# Patient Record
Sex: Male | Born: 1940 | Race: White | Hispanic: No | Smoking: Former smoker
Health system: Southern US, Community
[De-identification: ages and names within clinical notes are randomized; demographics above are authoritative.]

## PROBLEM LIST (undated history)

## (undated) DIAGNOSIS — I10 Essential (primary) hypertension: Secondary | ICD-10-CM

## (undated) DIAGNOSIS — I499 Cardiac arrhythmia, unspecified: Secondary | ICD-10-CM

## (undated) DIAGNOSIS — Z970 Presence of artificial eye: Secondary | ICD-10-CM

## (undated) DIAGNOSIS — K4030 Unilateral inguinal hernia, with obstruction, without gangrene, not specified as recurrent: Secondary | ICD-10-CM

## (undated) DIAGNOSIS — K219 Gastro-esophageal reflux disease without esophagitis: Secondary | ICD-10-CM

## (undated) DIAGNOSIS — I2510 Atherosclerotic heart disease of native coronary artery without angina pectoris: Secondary | ICD-10-CM

## (undated) DIAGNOSIS — R112 Nausea with vomiting, unspecified: Secondary | ICD-10-CM

## (undated) DIAGNOSIS — Z9889 Other specified postprocedural states: Secondary | ICD-10-CM

## (undated) DIAGNOSIS — M1990 Unspecified osteoarthritis, unspecified site: Secondary | ICD-10-CM

## (undated) DIAGNOSIS — N400 Enlarged prostate without lower urinary tract symptoms: Secondary | ICD-10-CM

## (undated) DIAGNOSIS — R0602 Shortness of breath: Secondary | ICD-10-CM

## (undated) DIAGNOSIS — E785 Hyperlipidemia, unspecified: Secondary | ICD-10-CM

## (undated) HISTORY — PX: CORONARY ANGIOPLASTY: SHX604

## (undated) HISTORY — PX: OTHER SURGICAL HISTORY: SHX169

## (undated) HISTORY — PX: HERNIA REPAIR: SHX51

---

## 2012-04-09 HISTORY — PX: CARDIAC CATHETERIZATION: SHX172

## 2013-11-10 ENCOUNTER — Other Ambulatory Visit: Payer: Self-pay | Admitting: Orthopedic Surgery

## 2013-11-10 NOTE — Progress Notes (Signed)
Preoperative surgical orders have been place into the Epic hospital system for Barry Morgan on 11/10/2013, 9:50 AM  by Patrica Duel for surgery on 11-25-2013.  Preop Total Hip - Anterior Approach orders including Experel Injecion, PO Tylenol, and IV Decadron as long as there are no contraindications to the above medications. Avel Peace, PA-C

## 2013-11-17 ENCOUNTER — Encounter (HOSPITAL_COMMUNITY): Payer: Self-pay | Admitting: Pharmacy Technician

## 2013-11-19 ENCOUNTER — Ambulatory Visit (HOSPITAL_COMMUNITY)
Admission: RE | Admit: 2013-11-19 | Discharge: 2013-11-19 | Disposition: A | Discharge status: Home / Self Care | Payer: Medicare Other | Source: Ambulatory Visit | Attending: Orthopedic Surgery | Admitting: Orthopedic Surgery

## 2013-11-19 ENCOUNTER — Encounter (HOSPITAL_COMMUNITY)
Admission: RE | Admit: 2013-11-19 | Discharge: 2013-11-19 | Disposition: A | Discharge status: Home / Self Care | Payer: Medicare Other | Source: Ambulatory Visit | Attending: Orthopedic Surgery | Admitting: Orthopedic Surgery

## 2013-11-19 ENCOUNTER — Encounter (HOSPITAL_COMMUNITY): Payer: Self-pay

## 2013-11-19 DIAGNOSIS — Z01818 Encounter for other preprocedural examination: Secondary | ICD-10-CM | POA: Insufficient documentation

## 2013-11-19 DIAGNOSIS — M25859 Other specified joint disorders, unspecified hip: Secondary | ICD-10-CM | POA: Insufficient documentation

## 2013-11-19 DIAGNOSIS — M76899 Other specified enthesopathies of unspecified lower limb, excluding foot: Secondary | ICD-10-CM | POA: Insufficient documentation

## 2013-11-19 DIAGNOSIS — Z01812 Encounter for preprocedural laboratory examination: Secondary | ICD-10-CM | POA: Insufficient documentation

## 2013-11-19 HISTORY — DX: Hyperlipidemia, unspecified: E78.5

## 2013-11-19 HISTORY — DX: Unspecified osteoarthritis, unspecified site: M19.90

## 2013-11-19 HISTORY — DX: Cardiac arrhythmia, unspecified: I49.9

## 2013-11-19 HISTORY — DX: Benign prostatic hyperplasia without lower urinary tract symptoms: N40.0

## 2013-11-19 HISTORY — DX: Shortness of breath: R06.02

## 2013-11-19 HISTORY — DX: Essential (primary) hypertension: I10

## 2013-11-19 HISTORY — DX: Atherosclerotic heart disease of native coronary artery without angina pectoris: I25.10

## 2013-11-19 HISTORY — DX: Other specified postprocedural states: R11.2

## 2013-11-19 HISTORY — DX: Gastro-esophageal reflux disease without esophagitis: K21.9

## 2013-11-19 HISTORY — DX: Other specified postprocedural states: Z98.890

## 2013-11-19 HISTORY — DX: Presence of artificial eye: Z97.0

## 2013-11-19 LAB — URINALYSIS, ROUTINE W REFLEX MICROSCOPIC
Bilirubin Urine: NEGATIVE
Glucose, UA: NEGATIVE mg/dL
Hgb urine dipstick: NEGATIVE
Leukocytes, UA: NEGATIVE
Nitrite: NEGATIVE
Specific Gravity, Urine: 1.018 (ref 1.005–1.030)
Urobilinogen, UA: 0.2 mg/dL (ref 0.0–1.0)
pH: 5.5 (ref 5.0–8.0)

## 2013-11-19 LAB — CBC
HCT: 40.4 % (ref 39.0–52.0)
MCH: 30.8 pg (ref 26.0–34.0)
MCHC: 33.9 g/dL (ref 30.0–36.0)
MCV: 90.8 fL (ref 78.0–100.0)
RBC: 4.45 MIL/uL (ref 4.22–5.81)
RDW: 13.7 % (ref 11.5–15.5)
WBC: 6 10*3/uL (ref 4.0–10.5)

## 2013-11-19 LAB — COMPREHENSIVE METABOLIC PANEL
BUN: 27 mg/dL — ABNORMAL HIGH (ref 6–23)
CO2: 25 mEq/L (ref 19–32)
Calcium: 9.6 mg/dL (ref 8.4–10.5)
Chloride: 100 mEq/L (ref 96–112)
Creatinine, Ser: 1.25 mg/dL (ref 0.50–1.35)
GFR calc non Af Amer: 56 mL/min — ABNORMAL LOW (ref >90)
Sodium: 136 mEq/L (ref 135–145)
Total Bilirubin: 0.5 mg/dL (ref 0.3–1.2)

## 2013-11-19 LAB — PROTIME-INR
INR: 0.91 (ref 0.00–1.49)
Prothrombin Time: 12.1 seconds (ref 11.6–15.2)

## 2013-11-19 LAB — ABO/RH: ABO/RH(D): B POS

## 2013-11-19 LAB — SURGICAL PCR SCREEN: MRSA, PCR: NEGATIVE

## 2013-11-19 NOTE — Patient Instructions (Addendum)
YOUR SURGERY IS SCHEDULED AT [facility]  ON:  Wednesday  12/17  REPORT TO  SHORT STAY CENTER AT:  8:30 AM      PHONE # FOR SHORT STAY IS [phone]  STAY ON YOUR ASPIRIN AND STAY ON ALL OF YOUR PRESCRIPTION MEDICATIONS  - EXCEPT PLAVIX  ( UNLESS OTHERWISE INSTRUCTED BY DR. ALUISIO'S OFFICE ) . STOP YOUR PLAVIX TODAY - AS PER ORDER DR. DR. Wynonia Hazard- YOUR CARDIOLOGIST.  DO NOT EAT OR DRINK ANYTHING AFTER MIDNIGHT THE NIGHT BEFORE YOUR SURGERY.  YOU MAY BRUSH YOUR TEETH, RINSE OUT YOUR MOUTH--BUT NO WATER, NO FOOD, NO CHEWING GUM, NO MINTS, NO CANDIES, NO CHEWING TOBACCO.  PLEASE TAKE THE FOLLOWING MEDICATIONS THE AM OF YOUR SURGERY WITH A FEW SIPS OF WATER:  PROTONIX. ASPIRIN.  IF YOU USE INHALERS--USE YOUR INHALERS THE AM OF YOUR SURGERY AND BRING INHALERS TO THE HOSPITAL.   IF YOU ARE DIABETIC:  DO NOT TAKE ANY DIABETIC MEDICATIONS THE AM OF YOUR SURGERY.  IF YOU TAKE INSULIN IN THE EVENINGS--PLEASE ONLY TAKE 1/2 NORMAL EVENING DOSE THE NIGHT BEFORE YOUR SURGERY.  NO INSULIN THE AM OF YOUR SURGERY. IF YOU HAVE SLEEP APNEA AND USE CPAP OR BIPAP--PLEASE BRING THE MASK AND THE TUBING.  DO NOT BRING YOUR MACHINE.  DO NOT BRING VALUABLES, MONEY, CREDIT CARDS.  DO NOT WEAR JEWELRY, MAKE-UP, NAIL POLISH AND NO METAL PINS OR CLIPS IN YOUR HAIR. CONTACT LENS, DENTURES / PARTIALS, GLASSES SHOULD NOT BE WORN TO SURGERY AND IN MOST CASES-HEARING AIDS WILL NEED TO BE REMOVED.  BRING YOUR GLASSES CASE, ANY EQUIPMENT NEEDED FOR YOUR CONTACT LENS. FOR PATIENTS ADMITTED TO THE HOSPITAL--CHECK OUT TIME THE DAY OF DISCHARGE IS 11:00 AM.  ALL INPATIENT ROOMS ARE PRIVATE - WITH BATHROOM, TELEPHONE, TELEVISION AND WIFI INTERNET.                                                     PLEASE READ OVER ANY  FACT SHEETS THAT YOU WERE GIVEN: MRSA INFORMATION, BLOOD TRANSFUSION INFORMATION, INCENTIVE SPIROMETER INFORMATION.  FAILURE TO FOLLOW THESE INSTRUCTIONS MAY RESULT IN THE CANCELLATION OF YOUR  SURGERY. PLEASE BE AWARE THAT YOU MAY NEED ADDITIONAL BLOOD DRAWN DAY OF YOUR SURGERY  PATIENT SIGNATURE_________________________________

## 2013-11-19 NOTE — Pre-Procedure Instructions (Signed)
CXR  WAS DONE TODAY - AS PER ANESTHESIOLOGIST'S GUIDELINES. EKG REPORT 05/22/13, ECHO REPORT 04/11/12., STRESS TEST REPORT 04/17/12, CARDIAC CATH /STENT PLACEMENT REPORT 04/28/12 - NOTE OF CARDIAC CLEARANCE AND STAY ON ASA BUT STOP PLAVIX 7 DAYS BEFORE SURGERY, AND CARDIOLOGY OFFICE NOTES 09/03/23 ON PT'S CHART FROM DR. KHAWAJA - [facility] IN [place].

## 2013-11-19 NOTE — Pre-Procedure Instructions (Signed)
PT'S CMET REPORT FAXED TO DR. ALUISIO'S OFFICE - BUN 27.

## 2013-11-24 ENCOUNTER — Other Ambulatory Visit: Payer: Self-pay | Admitting: Orthopedic Surgery

## 2013-11-24 NOTE — H&P (Signed)
Barry Morgan  DOB: 02/02/1941 Married / Language: English / Race: White Male  Date of Admission:  11-25-2013  Chief Complaint:  Left Hip Pain  History of Present Illness The patient is a 72 year old male who comes in today for a preoperative History and Physical. The patient is scheduled for a left total hip arthroplasty (anterior approach) to be performed by Dr. Frank V. Aluisio, MD at [facility] on 11/25/2013. The patient is a 72 year old male who presents for a recheck of Follow-up Hip. The patient is being followed for their left hip pain and osteoarthritis. They are 16 day(s) out from last IA hip cortisone injection (dr. ramos 09/09/2013). Symptoms reported today include: pain, pain at night, throbbing, stiffness (loss of motion), giving way, instability, pain with weightbearing and difficulty ambulating. The patient feels that they are doing 70 percent better (pain) and report their pain level to be moderate and 4-6 / 10. Current treatment includes: NSAIDs. The following medication has been used for pain control: antiinflammatory medication (mobic) and Tylenol. The patient presents today following IA cortisone injection-dr. ramos. The patient has reported improvement of their symptoms with: Cortisone injections (09/09/2013). The patient indicates that they have questions or concerns today regarding pain and their progress at this point. He states the hip has been bothering him for a long time. He is ready to go ahead and get it fixed. They have been treated conservatively in the past for the above stated problem and despite conservative measures, they continue to have progressive pain and severe functional limitations and dysfunction. They have failed non-operative management including home exercise, medications, and injections. It is felt that they would benefit from undergoing total joint replacement. Risks and benefits of the procedure have been discussed with the patient and  they elect to proceed with surgery. There are no active contraindications to surgery such as ongoing infection or rapidly progressive neurological disease.   Problem List/Past Medical Hip osteoarthritis (715.95). left Prostate Disease Sleep Apnea Gastroesophageal Reflux Disease Hypercholesterolemia Osteoarthritis Coronary artery disease Anxiety Disorder Cardiac Arrhythmia . Paroxsymal Atrial Fib.   Allergies No Known Drug Allergies    Family History Heart Disease. Father. father Cerebrovascular Accident. father First Degree Relatives. reported Osteoarthritis. father, sister, brother and grandfather fathers side    Social History Drug/Alcohol Rehab (Previously). no Exercise. Exercises weekly; does running / walking and other Former smoker Drug/Alcohol Rehab (Currently). no Current work status. retired Illicit drug use. no Tobacco / smoke exposure. no Pain Contract. yes Living situation. live alone Marital status. married Number of flights of stairs before winded. 4-5 Alcohol use. current drinker; only occasionally per week Tobacco use. Former smoker.    Medication History Amiodarone HCl (200MG Tablet, Oral) Active. Aspirin EC (81MG Tablet DR, Oral) Active. Atorvastatin Calcium (40MG Tablet, Oral) Active. Clopidogrel Bisulfate (75MG Tablet, Oral) Active. Glucosamine-Chondroitin (500-400MG Capsule, Oral) Active. Lisinopril (5MG Tablet, Oral) Active. Meloxicam (7.5MG Tablet, Oral) Active. Pantoprazole Sodium (40MG Tablet DR, Oral) Active. Tamsulosin HCl (0.4MG Capsule, Oral) Active. Multivitamins ( Oral) Active.    Past Surgical History Straighten Nasal Septum Hip Fracture and Surgery. left Colon Polyp Removal - Colonoscopy Heart Stents. One   Review of Systems General:Not Present- Chills, Fever, Night Sweats, Fatigue, Weight Gain, Weight Loss and Memory Loss. Skin:Not Present- Hives, Itching, Rash, Eczema and  Lesions. HEENT:Not Present- Tinnitus, Headache, Double Vision, Visual Loss, Hearing Loss and Dentures. Respiratory:Not Present- Shortness of breath with exertion, Shortness of breath at rest, Allergies, Coughing up blood and Chronic Cough.   Cardiovascular:Not Present- Chest Pain, Racing/skipping heartbeats, Difficulty Breathing Lying Down, Murmur, Swelling and Palpitations. Gastrointestinal:Not Present- Bloody Stool, Heartburn, Abdominal Pain, Vomiting, Nausea, Constipation, Diarrhea, Difficulty Swallowing, Jaundice and Loss of appetitie. Male Genitourinary:Present- Urinary frequency and Urinating at Night. Not Present- Blood in Urine, Weak urinary stream, Discharge, Flank Pain, Incontinence, Painful Urination and Urinary Retention. Musculoskeletal:Present- Joint Pain. Not Present- Muscle Weakness, Muscle Pain, Joint Swelling, Back Pain, Morning Stiffness and Spasms. Neurological:Not Present- Tremor, Dizziness, Blackout spells, Paralysis, Difficulty with balance and Weakness. Psychiatric:Not Present- Insomnia.    Vitals Weight: 215 lb Height: 71 in Weight was reported by patient. Height was reported by patient. Body Surface Area: 2.21 m Body Mass Index: 29.99 kg/m Pulse: 68 (Regular) Resp.: 14 (Unlabored) BP: 132/64 (Sitting, Right Arm, Standard)    Physical Exam The physical exam findings are as follows:   General Mental Status - Alert, cooperative and good historian. General Appearance- pleasant. Not in acute distress. Orientation- Oriented X3. Build & Nutrition- Well nourished and Well developed.   Head and Neck Head- normocephalic, atraumatic . Neck Global Assessment- supple. no bruit auscultated on the right and no bruit auscultated on the left.   Eye Pupil- Bilateral- Regular and Round. Motion- Bilateral- EOMI.   Chest and Lung Exam Auscultation: Breath sounds:- clear at anterior chest wall and - clear at posterior chest  wall. Adventitious sounds:- No Adventitious sounds.   Cardiovascular Auscultation:Rhythm- Regular rate and rhythm. Heart Sounds- S1 WNL and S2 WNL. Murmurs & Other Heart Sounds:Auscultation of the heart reveals - No Murmurs.   Abdomen Palpation/Percussion:Tenderness- Abdomen is non-tender to palpation. Rigidity (guarding)- Abdomen is soft. Auscultation:Auscultation of the abdomen reveals - Bowel sounds normal.   Male Genitourinary Not done, not pertinent to present illness  Musculoskeletal He is alert and oriented in no apparent distress. Evaluation of his left hip shows flexion to about 90. No internal rotation, about 10 degrees external rotation, 10-20 degrees of abduction. Right hip moves normally.  RADIOGRAPHS: Radiographs - AP pelvis, lateral of the left hip, he has severe bone on bone arthritis of the left hip, subchondral cyst, and large osteophyte formation.   Assessment & Plan Hip osteoarthritis (715.95) Story: left Impression: Left Hip  Note: Plan is for a Left Total Hip Replacement - Anterior Approach by Dr. Aluisio.  Plan is to go to [facility]  PCP - Dr. Clark - Patient has been seen preoperatively and felt to be stable for surgery. Cardiology - Dr. Khawaja - Patient has been seen preoperatively and felt to be stable for surgery. The cardiologist was okay stopping the Plavix 5 days prior to surgery but wanted him to stay on the Aspirin 81 mg throughout his surgical process.  The patient will not receive TXA (tranexamic acid) due to: Coronary Heart Disease  Signed electronically by Casady Voshell L Mirza Fessel, III PA-C 

## 2013-11-25 ENCOUNTER — Encounter (HOSPITAL_COMMUNITY): Payer: Self-pay | Admitting: *Deleted

## 2013-11-25 ENCOUNTER — Inpatient Hospital Stay (HOSPITAL_COMMUNITY): Payer: Medicare Other | Admitting: Anesthesiology

## 2013-11-25 ENCOUNTER — Inpatient Hospital Stay (HOSPITAL_COMMUNITY): Payer: Medicare Other

## 2013-11-25 ENCOUNTER — Inpatient Hospital Stay (HOSPITAL_COMMUNITY)
Admission: RE | Admit: 2013-11-25 | Discharge: 2013-11-28 | DRG: 470 | Disposition: A | Discharge status: Skilled Nursing Facility | Payer: Medicare Other | Source: Ambulatory Visit | Attending: Orthopedic Surgery | Admitting: Orthopedic Surgery

## 2013-11-25 ENCOUNTER — Encounter (HOSPITAL_COMMUNITY)
Admission: RE | Disposition: A | Discharge status: Skilled Nursing Facility | Payer: Self-pay | Source: Ambulatory Visit | Attending: Orthopedic Surgery

## 2013-11-25 ENCOUNTER — Encounter (HOSPITAL_COMMUNITY): Payer: Medicare Other | Admitting: Anesthesiology

## 2013-11-25 DIAGNOSIS — Z7982 Long term (current) use of aspirin: Secondary | ICD-10-CM

## 2013-11-25 DIAGNOSIS — N400 Enlarged prostate without lower urinary tract symptoms: Secondary | ICD-10-CM | POA: Diagnosis present

## 2013-11-25 DIAGNOSIS — E7800 Pure hypercholesterolemia, unspecified: Secondary | ICD-10-CM | POA: Diagnosis present

## 2013-11-25 DIAGNOSIS — Z96649 Presence of unspecified artificial hip joint: Secondary | ICD-10-CM

## 2013-11-25 DIAGNOSIS — I10 Essential (primary) hypertension: Secondary | ICD-10-CM | POA: Diagnosis present

## 2013-11-25 DIAGNOSIS — Z8249 Family history of ischemic heart disease and other diseases of the circulatory system: Secondary | ICD-10-CM

## 2013-11-25 DIAGNOSIS — Z9861 Coronary angioplasty status: Secondary | ICD-10-CM

## 2013-11-25 DIAGNOSIS — D62 Acute posthemorrhagic anemia: Secondary | ICD-10-CM | POA: Diagnosis not present

## 2013-11-25 DIAGNOSIS — F411 Generalized anxiety disorder: Secondary | ICD-10-CM | POA: Diagnosis present

## 2013-11-25 DIAGNOSIS — I4891 Unspecified atrial fibrillation: Secondary | ICD-10-CM | POA: Diagnosis present

## 2013-11-25 DIAGNOSIS — I2510 Atherosclerotic heart disease of native coronary artery without angina pectoris: Secondary | ICD-10-CM | POA: Diagnosis present

## 2013-11-25 DIAGNOSIS — M1610 Unilateral primary osteoarthritis, unspecified hip: Principal | ICD-10-CM | POA: Diagnosis present

## 2013-11-25 DIAGNOSIS — E785 Hyperlipidemia, unspecified: Secondary | ICD-10-CM | POA: Diagnosis present

## 2013-11-25 DIAGNOSIS — E871 Hypo-osmolality and hyponatremia: Secondary | ICD-10-CM | POA: Diagnosis not present

## 2013-11-25 DIAGNOSIS — K219 Gastro-esophageal reflux disease without esophagitis: Secondary | ICD-10-CM | POA: Diagnosis present

## 2013-11-25 DIAGNOSIS — Z87891 Personal history of nicotine dependence: Secondary | ICD-10-CM

## 2013-11-25 DIAGNOSIS — G4730 Sleep apnea, unspecified: Secondary | ICD-10-CM | POA: Diagnosis present

## 2013-11-25 DIAGNOSIS — M169 Osteoarthritis of hip, unspecified: Principal | ICD-10-CM | POA: Diagnosis present

## 2013-11-25 DIAGNOSIS — Z970 Presence of artificial eye: Secondary | ICD-10-CM

## 2013-11-25 HISTORY — PX: TOTAL HIP ARTHROPLASTY: SHX124

## 2013-11-25 SURGERY — ARTHROPLASTY, HIP, TOTAL, ANTERIOR APPROACH
Anesthesia: General | Site: Hip | Laterality: Left

## 2013-11-25 MED ORDER — ACETAMINOPHEN 325 MG PO TABS
650.0000 mg | ORAL_TABLET | Freq: Four times a day (QID) | ORAL | Status: DC | PRN
  Administered 2013-11-28: 650 mg via ORAL
  Filled 2013-11-25: qty 2

## 2013-11-25 MED ORDER — PHENOL 1.4 % MT LIQD
1.0000 | OROMUCOSAL | Status: DC | PRN
  Filled 2013-11-25: qty 177

## 2013-11-25 MED ORDER — ACETAMINOPHEN 500 MG PO TABS
1000.0000 mg | ORAL_TABLET | Freq: Four times a day (QID) | ORAL | Status: AC
  Administered 2013-11-25 – 2013-11-26 (×4): 1000 mg via ORAL
  Filled 2013-11-25 (×5): qty 2

## 2013-11-25 MED ORDER — AMIODARONE HCL 200 MG PO TABS
200.0000 mg | ORAL_TABLET | Freq: Every day | ORAL | Status: DC
  Administered 2013-11-25 – 2013-11-27 (×3): 200 mg via ORAL
  Filled 2013-11-25 (×4): qty 1

## 2013-11-25 MED ORDER — DOCUSATE SODIUM 100 MG PO CAPS
100.0000 mg | ORAL_CAPSULE | Freq: Two times a day (BID) | ORAL | Status: DC
  Administered 2013-11-25 – 2013-11-28 (×6): 100 mg via ORAL

## 2013-11-25 MED ORDER — PROPOFOL 10 MG/ML IV BOLUS
INTRAVENOUS | Status: AC
  Filled 2013-11-25: qty 20

## 2013-11-25 MED ORDER — RIVAROXABAN 10 MG PO TABS
10.0000 mg | ORAL_TABLET | Freq: Every day | ORAL | Status: DC
  Administered 2013-11-26 – 2013-11-28 (×3): 10 mg via ORAL
  Filled 2013-11-25 (×4): qty 1

## 2013-11-25 MED ORDER — METHOCARBAMOL 500 MG PO TABS
500.0000 mg | ORAL_TABLET | Freq: Four times a day (QID) | ORAL | Status: DC | PRN
  Administered 2013-11-25 – 2013-11-27 (×4): 500 mg via ORAL
  Filled 2013-11-25 (×5): qty 1

## 2013-11-25 MED ORDER — ACETAMINOPHEN 650 MG RE SUPP: 650.0000 mg | Freq: Four times a day (QID) | RECTAL | Status: DC | PRN

## 2013-11-25 MED ORDER — ATORVASTATIN CALCIUM 40 MG PO TABS
40.0000 mg | ORAL_TABLET | Freq: Every day | ORAL | Status: DC
  Administered 2013-11-25 – 2013-11-27 (×3): 40 mg via ORAL
  Filled 2013-11-25 (×4): qty 1

## 2013-11-25 MED ORDER — NEOSTIGMINE METHYLSULFATE 1 MG/ML IJ SOLN
INTRAMUSCULAR | Status: AC
  Filled 2013-11-25: qty 10

## 2013-11-25 MED ORDER — KETOROLAC TROMETHAMINE 15 MG/ML IJ SOLN
7.5000 mg | Freq: Four times a day (QID) | INTRAMUSCULAR | Status: AC | PRN
  Administered 2013-11-25: 7.5 mg via INTRAVENOUS
  Filled 2013-11-25: qty 1

## 2013-11-25 MED ORDER — ONDANSETRON HCL 4 MG PO TABS
4.0000 mg | ORAL_TABLET | Freq: Four times a day (QID) | ORAL | Status: DC | PRN
  Administered 2013-11-26 – 2013-11-27 (×2): 4 mg via ORAL
  Filled 2013-11-25 (×2): qty 1

## 2013-11-25 MED ORDER — DIPHENHYDRAMINE HCL 12.5 MG/5ML PO ELIX
12.5000 mg | ORAL_SOLUTION | ORAL | Status: DC | PRN
  Administered 2013-11-27 (×2): 25 mg via ORAL
  Filled 2013-11-25 (×2): qty 10

## 2013-11-25 MED ORDER — CEFAZOLIN SODIUM-DEXTROSE 2-3 GM-% IV SOLR
INTRAVENOUS | Status: AC
  Filled 2013-11-25: qty 50

## 2013-11-25 MED ORDER — DEXAMETHASONE SODIUM PHOSPHATE 10 MG/ML IJ SOLN
10.0000 mg | Freq: Every day | INTRAMUSCULAR | Status: AC
  Filled 2013-11-25: qty 1

## 2013-11-25 MED ORDER — DEXAMETHASONE 6 MG PO TABS
10.0000 mg | ORAL_TABLET | Freq: Every day | ORAL | Status: AC
  Administered 2013-11-26: 09:00:00 10 mg via ORAL
  Filled 2013-11-25: qty 1

## 2013-11-25 MED ORDER — BISACODYL 10 MG RE SUPP
10.0000 mg | Freq: Every day | RECTAL | Status: DC | PRN
  Administered 2013-11-27: 10 mg via RECTAL
  Filled 2013-11-25 (×2): qty 1

## 2013-11-25 MED ORDER — GLYCOPYRROLATE 0.2 MG/ML IJ SOLN
INTRAMUSCULAR | Status: AC
  Filled 2013-11-25: qty 2

## 2013-11-25 MED ORDER — PROMETHAZINE HCL 25 MG/ML IJ SOLN: 6.2500 mg | INTRAMUSCULAR | Status: DC | PRN

## 2013-11-25 MED ORDER — DEXAMETHASONE SODIUM PHOSPHATE 10 MG/ML IJ SOLN
INTRAMUSCULAR | Status: DC | PRN
  Administered 2013-11-25: 10 mg via INTRAVENOUS

## 2013-11-25 MED ORDER — BUPIVACAINE HCL (PF) 0.25 % IJ SOLN
INTRAMUSCULAR | Status: AC
  Filled 2013-11-25: qty 30

## 2013-11-25 MED ORDER — MENTHOL 3 MG MT LOZG
1.0000 | LOZENGE | OROMUCOSAL | Status: DC | PRN
  Administered 2013-11-26: 21:00:00 3 mg via ORAL
  Filled 2013-11-25: qty 9

## 2013-11-25 MED ORDER — METOCLOPRAMIDE HCL 5 MG/ML IJ SOLN
5.0000 mg | Freq: Three times a day (TID) | INTRAMUSCULAR | Status: DC | PRN
  Administered 2013-11-26: 10 mg via INTRAVENOUS
  Filled 2013-11-25: qty 2

## 2013-11-25 MED ORDER — EPHEDRINE SULFATE 50 MG/ML IJ SOLN
INTRAMUSCULAR | Status: AC
  Filled 2013-11-25: qty 1

## 2013-11-25 MED ORDER — POLYETHYLENE GLYCOL 3350 17 G PO PACK
17.0000 g | PACK | Freq: Every day | ORAL | Status: DC | PRN
  Administered 2013-11-27: 12:00:00 17 g via ORAL

## 2013-11-25 MED ORDER — BUPIVACAINE HCL 0.25 % IJ SOLN
INTRAMUSCULAR | Status: DC | PRN
  Administered 2013-11-25: 20 mL

## 2013-11-25 MED ORDER — EPHEDRINE SULFATE 50 MG/ML IJ SOLN
INTRAMUSCULAR | Status: DC | PRN
  Administered 2013-11-25 (×2): 5 mg via INTRAVENOUS

## 2013-11-25 MED ORDER — ONDANSETRON HCL 4 MG/2ML IJ SOLN
INTRAMUSCULAR | Status: DC | PRN
  Administered 2013-11-25: 4 mg via INTRAVENOUS

## 2013-11-25 MED ORDER — SUCCINYLCHOLINE CHLORIDE 20 MG/ML IJ SOLN
INTRAMUSCULAR | Status: AC
  Filled 2013-11-25: qty 1

## 2013-11-25 MED ORDER — ACETAMINOPHEN 500 MG PO TABS
1000.0000 mg | ORAL_TABLET | Freq: Once | ORAL | Status: AC
  Administered 2013-11-25: 1000 mg via ORAL
  Filled 2013-11-25: qty 2

## 2013-11-25 MED ORDER — LACTATED RINGERS IV SOLN
INTRAVENOUS | Status: DC | PRN
  Administered 2013-11-25 (×2): via INTRAVENOUS

## 2013-11-25 MED ORDER — MIDAZOLAM HCL 5 MG/5ML IJ SOLN
INTRAMUSCULAR | Status: DC | PRN
  Administered 2013-11-25: 2 mg via INTRAVENOUS

## 2013-11-25 MED ORDER — HYDROMORPHONE HCL PF 1 MG/ML IJ SOLN
INTRAMUSCULAR | Status: AC
  Filled 2013-11-25: qty 1

## 2013-11-25 MED ORDER — HYDROMORPHONE HCL PF 1 MG/ML IJ SOLN
0.2500 mg | INTRAMUSCULAR | Status: DC | PRN
  Administered 2013-11-25 (×2): 0.5 mg via INTRAVENOUS

## 2013-11-25 MED ORDER — CEFAZOLIN SODIUM-DEXTROSE 2-3 GM-% IV SOLR
2.0000 g | INTRAVENOUS | Status: AC
  Administered 2013-11-25: 2 g via INTRAVENOUS

## 2013-11-25 MED ORDER — SODIUM CHLORIDE 0.9 % IV SOLN
INTRAVENOUS | Status: DC
  Administered 2013-11-25 – 2013-11-26 (×3): via INTRAVENOUS

## 2013-11-25 MED ORDER — MORPHINE SULFATE 10 MG/ML IJ SOLN: 1.0000 mg | INTRAMUSCULAR | Status: DC | PRN

## 2013-11-25 MED ORDER — LACTATED RINGERS IV SOLN: INTRAVENOUS | Status: DC

## 2013-11-25 MED ORDER — SODIUM CHLORIDE 0.9 % IJ SOLN
INTRAMUSCULAR | Status: AC
  Filled 2013-11-25: qty 20

## 2013-11-25 MED ORDER — SUCCINYLCHOLINE CHLORIDE 20 MG/ML IJ SOLN
INTRAMUSCULAR | Status: DC | PRN
  Administered 2013-11-25: 100 mg via INTRAVENOUS

## 2013-11-25 MED ORDER — DEXAMETHASONE SODIUM PHOSPHATE 10 MG/ML IJ SOLN
INTRAMUSCULAR | Status: AC
  Filled 2013-11-25: qty 1

## 2013-11-25 MED ORDER — CHLORHEXIDINE GLUCONATE 4 % EX LIQD: 60.0000 mL | Freq: Once | CUTANEOUS | Status: DC

## 2013-11-25 MED ORDER — GLYCOPYRROLATE 0.2 MG/ML IJ SOLN
INTRAMUSCULAR | Status: DC | PRN
  Administered 2013-11-25: .6 mg via INTRAVENOUS

## 2013-11-25 MED ORDER — TAMSULOSIN HCL 0.4 MG PO CAPS
0.4000 mg | ORAL_CAPSULE | Freq: Every day | ORAL | Status: DC
  Administered 2013-11-25 – 2013-11-28 (×4): 0.4 mg via ORAL
  Filled 2013-11-25 (×4): qty 1

## 2013-11-25 MED ORDER — 0.9 % SODIUM CHLORIDE (POUR BTL) OPTIME
TOPICAL | Status: DC | PRN
  Administered 2013-11-25: 1000 mL

## 2013-11-25 MED ORDER — PANTOPRAZOLE SODIUM 40 MG PO TBEC
40.0000 mg | DELAYED_RELEASE_TABLET | Freq: Every day | ORAL | Status: DC
  Administered 2013-11-26 – 2013-11-28 (×3): 40 mg via ORAL
  Filled 2013-11-25 (×3): qty 1

## 2013-11-25 MED ORDER — FENTANYL CITRATE 0.05 MG/ML IJ SOLN
INTRAMUSCULAR | Status: AC
  Filled 2013-11-25: qty 2

## 2013-11-25 MED ORDER — SODIUM CHLORIDE 0.9 % IJ SOLN
INTRAMUSCULAR | Status: DC | PRN
  Administered 2013-11-25: 13:00:00

## 2013-11-25 MED ORDER — SODIUM CHLORIDE 0.9 % IV SOLN: INTRAVENOUS | Status: DC

## 2013-11-25 MED ORDER — PROPOFOL 10 MG/ML IV BOLUS
INTRAVENOUS | Status: DC | PRN
  Administered 2013-11-25: 160 mg via INTRAVENOUS

## 2013-11-25 MED ORDER — DEXAMETHASONE SODIUM PHOSPHATE 10 MG/ML IJ SOLN: 10.0000 mg | Freq: Once | INTRAMUSCULAR | Status: DC

## 2013-11-25 MED ORDER — BUPIVACAINE LIPOSOME 1.3 % IJ SUSP
20.0000 mL | Freq: Once | INTRAMUSCULAR | Status: DC
  Filled 2013-11-25: qty 20

## 2013-11-25 MED ORDER — LIDOCAINE HCL (CARDIAC) 20 MG/ML IV SOLN
INTRAVENOUS | Status: DC | PRN
  Administered 2013-11-25: 100 mg via INTRAVENOUS

## 2013-11-25 MED ORDER — OXYCODONE HCL 5 MG PO TABS
5.0000 mg | ORAL_TABLET | ORAL | Status: DC | PRN
  Administered 2013-11-25 (×3): 5 mg via ORAL
  Administered 2013-11-26 – 2013-11-27 (×10): 10 mg via ORAL
  Administered 2013-11-27 – 2013-11-28 (×4): 5 mg via ORAL
  Filled 2013-11-25 (×3): qty 2
  Filled 2013-11-25: qty 1
  Filled 2013-11-25 (×2): qty 2
  Filled 2013-11-25: qty 1
  Filled 2013-11-25: qty 2
  Filled 2013-11-25: qty 1
  Filled 2013-11-25: qty 2
  Filled 2013-11-25 (×2): qty 1
  Filled 2013-11-25: qty 2
  Filled 2013-11-25: qty 1
  Filled 2013-11-25 (×3): qty 2

## 2013-11-25 MED ORDER — FENTANYL CITRATE 0.05 MG/ML IJ SOLN
INTRAMUSCULAR | Status: DC | PRN
  Administered 2013-11-25 (×4): 50 ug via INTRAVENOUS

## 2013-11-25 MED ORDER — TRAMADOL HCL 50 MG PO TABS: 50.0000 mg | ORAL_TABLET | Freq: Four times a day (QID) | ORAL | Status: DC | PRN

## 2013-11-25 MED ORDER — FLEET ENEMA 7-19 GM/118ML RE ENEM: 1.0000 | ENEMA | Freq: Once | RECTAL | Status: AC | PRN

## 2013-11-25 MED ORDER — ONDANSETRON HCL 4 MG/2ML IJ SOLN
INTRAMUSCULAR | Status: AC
  Filled 2013-11-25: qty 2

## 2013-11-25 MED ORDER — CEFAZOLIN SODIUM-DEXTROSE 2-3 GM-% IV SOLR
2.0000 g | Freq: Four times a day (QID) | INTRAVENOUS | Status: AC
  Administered 2013-11-25 – 2013-11-26 (×2): 2 g via INTRAVENOUS
  Filled 2013-11-25 (×2): qty 50

## 2013-11-25 MED ORDER — METOCLOPRAMIDE HCL 5 MG PO TABS
5.0000 mg | ORAL_TABLET | Freq: Three times a day (TID) | ORAL | Status: DC | PRN
  Filled 2013-11-25: qty 2

## 2013-11-25 MED ORDER — SODIUM CHLORIDE 0.9 % IJ SOLN
INTRAMUSCULAR | Status: AC
  Filled 2013-11-25: qty 50

## 2013-11-25 MED ORDER — ROCURONIUM BROMIDE 100 MG/10ML IV SOLN
INTRAVENOUS | Status: DC | PRN
  Administered 2013-11-25 (×2): 10 mg via INTRAVENOUS
  Administered 2013-11-25: 30 mg via INTRAVENOUS

## 2013-11-25 MED ORDER — METHOCARBAMOL 100 MG/ML IJ SOLN
500.0000 mg | Freq: Four times a day (QID) | INTRAVENOUS | Status: DC | PRN
  Administered 2013-11-25: 500 mg via INTRAVENOUS
  Filled 2013-11-25: qty 5

## 2013-11-25 MED ORDER — MIDAZOLAM HCL 2 MG/2ML IJ SOLN
INTRAMUSCULAR | Status: AC
  Filled 2013-11-25: qty 2

## 2013-11-25 MED ORDER — NEOSTIGMINE METHYLSULFATE 1 MG/ML IJ SOLN
INTRAMUSCULAR | Status: DC | PRN
  Administered 2013-11-25: 2 mg via INTRAVENOUS

## 2013-11-25 MED ORDER — ONDANSETRON HCL 4 MG/2ML IJ SOLN: 4.0000 mg | Freq: Four times a day (QID) | INTRAMUSCULAR | Status: DC | PRN

## 2013-11-25 SURGICAL SUPPLY — 39 items
BAG ZIPLOCK 12X15 (MISCELLANEOUS) ×4 IMPLANT
BLADE SAW SGTL 18X1.27X75 (BLADE) ×2 IMPLANT
CAPT HIP PF MOP ×2 IMPLANT
DECANTER SPIKE VIAL GLASS SM (MISCELLANEOUS) ×2 IMPLANT
DRAPE C-ARM 42X120 X-RAY (DRAPES) ×2 IMPLANT
DRAPE STERI IOBAN 125X83 (DRAPES) ×2 IMPLANT
DRAPE U-SHAPE 47X51 STRL (DRAPES) ×6 IMPLANT
DRSG ADAPTIC 3X8 NADH LF (GAUZE/BANDAGES/DRESSINGS) ×2 IMPLANT
DRSG MEPILEX BORDER 4X4 (GAUZE/BANDAGES/DRESSINGS) ×2 IMPLANT
DRSG MEPILEX BORDER 4X8 (GAUZE/BANDAGES/DRESSINGS) ×2 IMPLANT
DURAPREP 26ML APPLICATOR (WOUND CARE) ×2 IMPLANT
ELECT BLADE 6.5 EXT (BLADE) ×2 IMPLANT
ELECT REM PT RETURN 9FT ADLT (ELECTROSURGICAL) ×2
ELECTRODE REM PT RTRN 9FT ADLT (ELECTROSURGICAL) ×1 IMPLANT
EVACUATOR 1/8 PVC DRAIN (DRAIN) ×2 IMPLANT
FACESHIELD LNG OPTICON STERILE (SAFETY) ×8 IMPLANT
GLOVE BIO SURGEON STRL SZ7.5 (GLOVE) ×2 IMPLANT
GLOVE BIO SURGEON STRL SZ8 (GLOVE) ×4 IMPLANT
GLOVE BIOGEL PI IND STRL 8 (GLOVE) ×2 IMPLANT
GLOVE BIOGEL PI INDICATOR 8 (GLOVE) ×2
GOWN PREVENTION PLUS LG XLONG (DISPOSABLE) ×2 IMPLANT
GOWN STRL REIN XL XLG (GOWN DISPOSABLE) ×2 IMPLANT
KIT BASIN OR (CUSTOM PROCEDURE TRAY) ×2 IMPLANT
NDL SAFETY ECLIPSE 18X1.5 (NEEDLE) ×2 IMPLANT
NEEDLE HYPO 18GX1.5 SHARP (NEEDLE) ×2
PACK TOTAL JOINT (CUSTOM PROCEDURE TRAY) ×2 IMPLANT
PADDING CAST COTTON 6X4 STRL (CAST SUPPLIES) ×2 IMPLANT
SPONGE GAUZE 4X4 12PLY (GAUZE/BANDAGES/DRESSINGS) IMPLANT
STRIP CLOSURE SKIN 1/2X4 (GAUZE/BANDAGES/DRESSINGS) ×2 IMPLANT
SUCTION FRAZIER 12FR DISP (SUCTIONS) IMPLANT
SUT ETHIBOND NAB CT1 #1 30IN (SUTURE) ×2 IMPLANT
SUT MNCRL AB 4-0 PS2 18 (SUTURE) ×2 IMPLANT
SUT VIC AB 2-0 CT1 27 (SUTURE) ×2
SUT VIC AB 2-0 CT1 TAPERPNT 27 (SUTURE) ×2 IMPLANT
SUT VLOC 180 0 24IN GS25 (SUTURE) ×2 IMPLANT
SYR 20CC LL (SYRINGE) ×2 IMPLANT
SYR 50ML LL SCALE MARK (SYRINGE) ×2 IMPLANT
TOWEL OR 17X26 10 PK STRL BLUE (TOWEL DISPOSABLE) ×2 IMPLANT
TRAY FOLEY CATH 14FRSI W/METER (CATHETERS) ×2 IMPLANT

## 2013-11-25 NOTE — H&P (View-Only) (Signed)
Barry Morgan  DOB: 01/01/1941 Married / Language: English / Race: White Male  Date of Admission:  11-25-2013  Chief Complaint:  Left Hip Pain  History of Present Illness The patient is a 72 year old male who comes in today for a preoperative History and Physical. The patient is scheduled for a left total hip arthroplasty (anterior approach) to be performed by Dr. Gus Rankin. Aluisio, MD at [facility] on 11/25/2013. The patient is a 72 year old male who presents for a recheck of Follow-up Hip. The patient is being followed for their left hip pain and osteoarthritis. They are 16 day(s) out from last IA hip cortisone injection (dr. Ethelene Hal 09/09/2013). Symptoms reported today include: pain, pain at night, throbbing, stiffness (loss of motion), giving way, instability, pain with weightbearing and difficulty ambulating. The patient feels that they are doing 70 percent better (pain) and report their pain level to be moderate and 4-6 / 10. Current treatment includes: NSAIDs. The following medication has been used for pain control: antiinflammatory medication (mobic) and Tylenol. The patient presents today following IA cortisone injection-dr. ramos. The patient has reported improvement of their symptoms with: Cortisone injections (09/09/2013). The patient indicates that they have questions or concerns today regarding pain and their progress at this point. He states the hip has been bothering him for a long time. He is ready to go ahead and get it fixed. They have been treated conservatively in the past for the above stated problem and despite conservative measures, they continue to have progressive pain and severe functional limitations and dysfunction. They have failed non-operative management including home exercise, medications, and injections. It is felt that they would benefit from undergoing total joint replacement. Risks and benefits of the procedure have been discussed with the patient and  they elect to proceed with surgery. There are no active contraindications to surgery such as ongoing infection or rapidly progressive neurological disease.   Problem List/Past Medical Hip osteoarthritis (715.95). left Prostate Disease Sleep Apnea Gastroesophageal Reflux Disease Hypercholesterolemia Osteoarthritis Coronary artery disease Anxiety Disorder Cardiac Arrhythmia . Paroxsymal Atrial Fib.   Allergies No Known Drug Allergies    Family History Heart Disease. Father. father Cerebrovascular Accident. father First Degree Relatives. reported Osteoarthritis. father, sister, brother and grandfather fathers side    Social History Drug/Alcohol Rehab (Previously). no Exercise. Exercises weekly; does running / walking and other Former smoker Drug/Alcohol Rehab (Currently). no Current work status. retired Illicit drug use. no Tobacco / smoke exposure. no Pain Contract. yes Living situation. live alone Marital status. married Number of flights of stairs before winded. 4-5 Alcohol use. current drinker; only occasionally per week Tobacco use. Former smoker.    Medication History Amiodarone HCl (200MG  Tablet, Oral) Active. Aspirin EC (81MG  Tablet DR, Oral) Active. Atorvastatin Calcium (40MG  Tablet, Oral) Active. Clopidogrel Bisulfate (75MG  Tablet, Oral) Active. Glucosamine-Chondroitin (500-400MG  Capsule, Oral) Active. Lisinopril (5MG  Tablet, Oral) Active. Meloxicam (7.5MG  Tablet, Oral) Active. Pantoprazole Sodium (40MG  Tablet DR, Oral) Active. Tamsulosin HCl (0.4MG  Capsule, Oral) Active. Multivitamins ( Oral) Active.    Past Surgical History Straighten Nasal Septum Hip Fracture and Surgery. left Colon Polyp Removal - Colonoscopy Heart Stents. One   Review of Systems General:Not Present- Chills, Fever, Night Sweats, Fatigue, Weight Gain, Weight Loss and Memory Loss. Skin:Not Present- Hives, Itching, Rash, Eczema and  Lesions. HEENT:Not Present- Tinnitus, Headache, Double Vision, Visual Loss, Hearing Loss and Dentures. Respiratory:Not Present- Shortness of breath with exertion, Shortness of breath at rest, Allergies, Coughing up blood and Chronic Cough.  Cardiovascular:Not Present- Chest Pain, Racing/skipping heartbeats, Difficulty Breathing Lying Down, Murmur, Swelling and Palpitations. Gastrointestinal:Not Present- Bloody Stool, Heartburn, Abdominal Pain, Vomiting, Nausea, Constipation, Diarrhea, Difficulty Swallowing, Jaundice and Loss of appetitie. Male Genitourinary:Present- Urinary frequency and Urinating at Night. Not Present- Blood in Urine, Weak urinary stream, Discharge, Flank Pain, Incontinence, Painful Urination and Urinary Retention. Musculoskeletal:Present- Joint Pain. Not Present- Muscle Weakness, Muscle Pain, Joint Swelling, Back Pain, Morning Stiffness and Spasms. Neurological:Not Present- Tremor, Dizziness, Blackout spells, Paralysis, Difficulty with balance and Weakness. Psychiatric:Not Present- Insomnia.    Vitals Weight: 215 lb Height: 71 in Weight was reported by patient. Height was reported by patient. Body Surface Area: 2.21 m Body Mass Index: 29.99 kg/m Pulse: 68 (Regular) Resp.: 14 (Unlabored) BP: 132/64 (Sitting, Right Arm, Standard)    Physical Exam The physical exam findings are as follows:   General Mental Status - Alert, cooperative and good historian. General Appearance- pleasant. Not in acute distress. Orientation- Oriented X3. Build & Nutrition- Well nourished and Well developed.   Head and Neck Head- normocephalic, atraumatic . Neck Global Assessment- supple. no bruit auscultated on the right and no bruit auscultated on the left.   Eye Pupil- Bilateral- Regular and Round. Motion- Bilateral- EOMI.   Chest and Lung Exam Auscultation: Breath sounds:- clear at anterior chest wall and - clear at posterior chest  wall. Adventitious sounds:- No Adventitious sounds.   Cardiovascular Auscultation:Rhythm- Regular rate and rhythm. Heart Sounds- S1 WNL and S2 WNL. Murmurs & Other Heart Sounds:Auscultation of the heart reveals - No Murmurs.   Abdomen Palpation/Percussion:Tenderness- Abdomen is non-tender to palpation. Rigidity (guarding)- Abdomen is soft. Auscultation:Auscultation of the abdomen reveals - Bowel sounds normal.   Male Genitourinary Not done, not pertinent to present illness  Musculoskeletal He is alert and oriented in no apparent distress. Evaluation of his left hip shows flexion to about 90. No internal rotation, about 10 degrees external rotation, 10-20 degrees of abduction. Right hip moves normally.  RADIOGRAPHS: Radiographs - AP pelvis, lateral of the left hip, he has severe bone on bone arthritis of the left hip, subchondral cyst, and large osteophyte formation.   Assessment & Plan Hip osteoarthritis (715.95) Story: left Impression: Left Hip  Note: Plan is for a Left Total Hip Replacement - Anterior Approach by Dr. Lequita Halt.  Plan is to go to [facility]  PCP - Dr. Chestine Spore - Patient has been seen preoperatively and felt to be stable for surgery. Cardiology - Dr. Wynonia Hazard - Patient has been seen preoperatively and felt to be stable for surgery. The cardiologist was okay stopping the Plavix 5 days prior to surgery but wanted him to stay on the Aspirin 81 mg throughout his surgical process.  The patient will not receive TXA (tranexamic acid) due to: Coronary Heart Disease  Signed electronically by Lauraine Rinne, III PA-C

## 2013-11-25 NOTE — Addendum Note (Signed)
Addendum created 11/25/13 1418 by Doran Clay, CRNA   Modules edited: Anesthesia LDA

## 2013-11-25 NOTE — Interval H&P Note (Signed)
History and Physical Interval Note:  11/25/2013 10:05 AM  Barry Morgan  has presented today for surgery, with the diagnosis of OA LEFT HIP  The various methods of treatment have been discussed with the patient and family. After consideration of risks, benefits and other options for treatment, the patient has consented to  Procedure(s): LEFT TOTAL HIP ARTHROPLASTY ANTERIOR APPROACH (Left) as a surgical intervention .  The patient's history has been reviewed, patient examined, no change in status, stable for surgery.  I have reviewed the patient's chart and labs.  Questions were answered to the patient's satisfaction.     Loanne Drilling

## 2013-11-25 NOTE — Op Note (Signed)
OPERATIVE REPORT  PREOPERATIVE DIAGNOSIS: Osteoarthritis of the Left hip.   POSTOPERATIVE DIAGNOSIS: Osteoarthritis of the Left  hip.   PROCEDURE: Left total hip arthroplasty, anterior approach.   SURGEON: Ollen Gross, MD   ASSISTANT: Avel Peace, Pa-C  ANESTHESIA:  General  ESTIMATED BLOOD LOSS:- 700 ml  DRAINS: Hemovac x1.   COMPLICATIONS: None   CONDITION: PACU - hemodynamically stable.   BRIEF CLINICAL NOTE: Barry Morgan is a 72 y.o. male who has advanced end-  stage arthritis of his Left  hip with progressively worsening pain and  dysfunction.The patient has failed nonoperative management and presents for  total hip arthroplasty.   PROCEDURE IN DETAIL: After successful administration of spinal  anesthetic, the traction boots for the [facility] bed were placed on both  feet and the patient was placed onto the [facility] bed, boots placed into the leg  holders. The Left hip was then isolated from the perineum with plastic  drapes and prepped and draped in the usual sterile fashion. ASIS and  greater trochanter were marked and a oblique incision was made, starting  at about 1 cm lateral and 2 cm distal to the ASIS and coursing towards  the anterior cortex of the femur. The skin was cut with a 10 blade  through subcutaneous tissue to the level of the fascia overlying the  tensor fascia lata muscle. The fascia was then incised in line with the  incision at the junction of the anterior third and posterior 2/3rd. The  muscle was teased off the fascia and then the interval between the TFL  and the rectus was developed. The Hohmann retractor was then placed at  the top of the femoral neck over the capsule. The vessels overlying the  capsule were cauterized and the fat on top of the capsule was removed.  A Hohmann retractor was then placed anterior underneath the rectus  femoris to give exposure to the entire anterior capsule. A T-shaped  capsulotomy was performed. The  edges were tagged and the femoral head  was identified.       Osteophytes are removed off the superior acetabulum.  The femoral neck was then cut in situ with an oscillating saw. Traction  was then applied to the left lower extremity utilizing the [facility]  traction. The femoral head was then removed. Retractors were placed  around the acetabulum and then circumferential removal of the labrum was  Performed.He had a massive amount of  Osteophytes which were also removed. Reaming starts at 47 mm to  medialize and  Increased in 2 mm increments to 55 mm. We reamed in  approximately 40 degrees of abduction, 20 degrees anteversion. A 56 mm  pinnacle acetabular shell was then impacted in anatomic position under  fluoroscopic guidance with excellent purchase. We did not need to place  any additional dome screws. A 36 mm neutral + 4 marathon liner was then  placed into the acetabular shell.       The femoral lift was then placed along the lateral aspect of the femur  just distal to the vastus ridge. The leg was  externally rotated and capsule  was stripped off the inferior aspect of the femoral neck down to the  level of the lesser trochanter, this was done with electrocautery. The femur was lifted after this was performed. The  leg was then placed and extended in adducted position to essentially delivering the femur. We also removed the capsule superiorly and  the  piriformis from the piriformis fossa to gain excellent exposure of the  proximal femur. Rongeur was used to remove some cancellous bone to get  into the lateral portion of the proximal femur for placement of the  initial starter reamer. The starter broaches was placed  the starter broach  and was shown to go down the center of the canal. Broaching  with the  Corail system was then performed starting at size 8, coursing  Up to size 12. A size 12 had excellent torsional and rotational  and axial stability. The trial high offset neck was then  placed  with a 36 + 8.5 trial head. The hip was then reduced. We confirmed that  the stem was in the canal both on AP and lateral x-rays. It also has excellent sizing. The hip was reduced with outstanding stability through full extension, full external rotation,  and then flexion in adduction internal rotation. AP pelvis was taken  and the leg lengths were measured and found to be exactly equal. Hip  was then dislocated again and the femoral head and neck removed. The  femoral broach was removed. Size 12 Corail stem with a high offset  neck was then impacted into the femur following native anteversion. Has  excellent purchase in the canal. Excellent torsional and rotational and  axial stability. It is confirmed to be in the canal on AP and lateral  fluoroscopic views. The 36 + 8.5 metal head was placed and the hip  reduced with outstanding stability. Again AP pelvis was taken and it  confirmed that the leg lengths were equal. The wound was then copiously  irrigated with saline solution and the capsule reattached and repaired  with Ethibond suture.  20 mL of Exparel mixed with 50 mL of saline then additional 20 ml of .25% Bupivicaine injected into the capsule and into the edge of the tensor fascia lata as well as subcutaneous tissue. The fascia overlying the tensor fascia lata was  then closed with a running #1 V-Loc. Subcu was closed with interrupted  2-0 Vicryl and subcuticular running 4-0 Monocryl. Incision was cleaned  and dried. Steri-Strips and a bulky sterile dressing applied. Hemovac  drain was hooked to suction and then he was awakened and transported to  recovery in stable condition.        Please note that a surgical assistant was a medical necessity for this procedure to perform it in a safe and expeditious manner. Assistant was necessary to provide appropriate retraction of vital neurovascular structures and to prevent femoral fracture and allow for anatomic placement of the  prosthesis.  Ollen Gross, M.D.

## 2013-11-25 NOTE — Anesthesia Preprocedure Evaluation (Signed)
Anesthesia Evaluation  Patient identified by MRN, date of birth, ID band Patient awake    Reviewed: Allergy & Precautions, H&P , NPO status , Patient's Chart, lab work & pertinent test results  Airway Mallampati: II TM Distance: >3 FB Neck ROM: Full    Dental  (+) Teeth Intact and Dental Advisory Given   Pulmonary former smoker,  breath sounds clear to auscultation  Pulmonary exam normal       Cardiovascular hypertension, Pt. on medications + CAD and + Cardiac Stents + dysrhythmias Atrial Fibrillation Rhythm:Regular Rate:Normal     Neuro/Psych negative neurological ROS  negative psych ROS   GI/Hepatic Neg liver ROS, GERD-  Medicated,  Endo/Other  negative endocrine ROS  Renal/GU negative Renal ROS  negative genitourinary   Musculoskeletal negative musculoskeletal ROS (+)   Abdominal   Peds  Hematology negative hematology ROS (+)   Anesthesia Other Findings   Reproductive/Obstetrics                           Anesthesia Physical Anesthesia Plan  ASA: III  Anesthesia Plan: General   Post-op Pain Management:    Induction: Intravenous  Airway Management Planned: Oral ETT  Additional Equipment:   Intra-op Plan:   Post-operative Plan: Extubation in OR  Informed Consent: I have reviewed the patients History and Physical, chart, labs and discussed the procedure including the risks, benefits and alternatives for the proposed anesthesia with the patient or authorized representative who has indicated his/her understanding and acceptance.   Dental advisory given  Plan Discussed with: CRNA  Anesthesia Plan Comments:         Anesthesia Quick Evaluation

## 2013-11-25 NOTE — Preoperative (Signed)
Beta Blockers   Reason not to administer Beta Blockers:Not Applicable 

## 2013-11-25 NOTE — Anesthesia Postprocedure Evaluation (Signed)
Anesthesia Post Note  Patient: Barry Morgan  Procedure(s) Performed: Procedure(s) (LRB): LEFT TOTAL HIP ARTHROPLASTY ANTERIOR APPROACH (Left)  Anesthesia type: General  Patient location: PACU  Post pain: Pain level controlled  Post assessment: Post-op Vital signs reviewed  Last Vitals:  Filed Vitals:   11/25/13 1330  BP: 103/86  Pulse: 82  Temp: 36.4 C  Resp: 16    Post vital signs: Reviewed  Level of consciousness: sedated  Complications: No apparent anesthesia complications

## 2013-11-25 NOTE — Transfer of Care (Signed)
Immediate Anesthesia Transfer of Care Note  Patient: Barry Morgan  Procedure(s) Performed: Procedure(s): LEFT TOTAL HIP ARTHROPLASTY ANTERIOR APPROACH (Left)  Patient Location: PACU  Anesthesia Type:General  Level of Consciousness: sedated  Airway & Oxygen Therapy: Patient Spontanous Breathing and Patient connected to face mask oxygen  Post-op Assessment: Report given to PACU RN and Post -op Vital signs reviewed and stable  Post vital signs: Reviewed and stable  Complications: No apparent anesthesia complications

## 2013-11-26 DIAGNOSIS — D62 Acute posthemorrhagic anemia: Secondary | ICD-10-CM | POA: Diagnosis not present

## 2013-11-26 DIAGNOSIS — E871 Hypo-osmolality and hyponatremia: Secondary | ICD-10-CM | POA: Diagnosis not present

## 2013-11-26 LAB — CBC
HCT: 26.5 % — ABNORMAL LOW (ref 39.0–52.0)
Hemoglobin: 8.9 g/dL — ABNORMAL LOW (ref 13.0–17.0)
MCHC: 33.6 g/dL (ref 30.0–36.0)
MCV: 91.1 fL (ref 78.0–100.0)
RBC: 2.91 MIL/uL — ABNORMAL LOW (ref 4.22–5.81)
WBC: 9 10*3/uL (ref 4.0–10.5)

## 2013-11-26 LAB — BASIC METABOLIC PANEL
BUN: 24 mg/dL — ABNORMAL HIGH (ref 6–23)
CO2: 24 mEq/L (ref 19–32)
Calcium: 7.9 mg/dL — ABNORMAL LOW (ref 8.4–10.5)
Chloride: 100 mEq/L (ref 96–112)
Creatinine, Ser: 1.06 mg/dL (ref 0.50–1.35)
GFR calc Af Amer: 79 mL/min — ABNORMAL LOW (ref >90)
Glucose, Bld: 135 mg/dL — ABNORMAL HIGH (ref 70–99)
Potassium: 4.6 mEq/L (ref 3.5–5.1)
Sodium: 131 mEq/L — ABNORMAL LOW (ref 135–145)

## 2013-11-26 MED ORDER — ZOLPIDEM TARTRATE 5 MG PO TABS
5.0000 mg | ORAL_TABLET | Freq: Every evening | ORAL | Status: DC | PRN
  Administered 2013-11-26 (×2): 5 mg via ORAL
  Filled 2013-11-26 (×2): qty 1

## 2013-11-26 MED ORDER — ASPIRIN 81 MG PO CHEW
81.0000 mg | CHEWABLE_TABLET | Freq: Every day | ORAL | Status: DC
  Administered 2013-11-26 – 2013-11-28 (×3): 81 mg via ORAL
  Filled 2013-11-26 (×3): qty 1

## 2013-11-26 MED ORDER — POLYSACCHARIDE IRON COMPLEX 150 MG PO CAPS
150.0000 mg | ORAL_CAPSULE | Freq: Every day | ORAL | Status: DC
  Administered 2013-11-26 – 2013-11-28 (×3): 150 mg via ORAL
  Filled 2013-11-26 (×3): qty 1

## 2013-11-26 MED ORDER — PROMETHAZINE HCL 25 MG/ML IJ SOLN
6.2500 mg | Freq: Four times a day (QID) | INTRAMUSCULAR | Status: DC | PRN
  Administered 2013-11-26 – 2013-11-27 (×2): 6.25 mg via INTRAVENOUS
  Filled 2013-11-26 (×2): qty 1

## 2013-11-26 NOTE — Evaluation (Signed)
Occupational Therapy Evaluation Patient Details Name: Barry Morgan MRN: [id number] DOB: Apr 18, 1941 Today's Date: 11/26/2013 Time: 1191-4782 OT Time Calculation (min): 13 min  OT Assessment / Plan / Recommendation History of present illness Pt with L THA, anterior   Clinical Impression   Pt demos decline in function with ADLs and ADL mobility safety and would benefit from acute OT services to address impairments to increase level of function and safety. Pt plans to d/c to a SNF for rehab before returning home. Pt pleasant, cooperative and humourous    OT Assessment  Patient needs continued OT Services    Follow Up Recommendations  SNF;Supervision/Assistance - 24 hour     Barriers to Discharge  none, pt plans to d/c to [facility] for re[facility]  Equipment Recommendations  None recommended by OT    Recommendations for Other Services    Frequency  Min 2X/week    Precautions / Restrictions Precautions Precautions: Fall Restrictions Weight Bearing Restrictions: No Other Position/Activity Restrictions: WBAT   Pertinent Vitals/Pain 2/10 L hip pain    ADL  Grooming: Performed;Wash/dry hands;Wash/dry face;Min guard Where Assessed - Grooming: Supported standing Upper Body Bathing: Simulated;Supervision/safety;Set up Where Assessed - Upper Body Bathing: Unsupported sitting Lower Body Bathing: Simulated;Moderate assistance Upper Body Dressing: Performed;Supervision/safety;Set up Where Assessed - Upper Body Dressing: Unsupported sitting Lower Body Dressing: Performed;Moderate assistance;Maximal assistance Toilet Transfer: Performed;Minimal assistance Toilet Transfer Method: Sit to stand Toilet Transfer Equipment: Regular height toilet;Grab bars Toileting - Clothing Manipulation and Hygiene: Performed;Min guard;Minimal assistance Where Assessed - Glass blower/designer Manipulation and Hygiene: Standing Tub/Shower Transfer Method: Not assessed Equipment Used: Rolling  walker Transfers/Ambulation Related to ADLs: pt requires verbal cues to use RW, correct hand placement. Pt likes to hold on to furniture when walknig instead of using RW    OT Diagnosis: Acute pain  OT Problem List: Decreased knowledge of use of DME or AE;Decreased safety awareness;Impaired balance (sitting and/or standing) OT Treatment Interventions: Self-care/ADL training;Therapeutic exercise;Patient/family education;Neuromuscular education;Balance training;Therapeutic activities;DME and/or AE instruction   OT Goals(Current goals can be found in the care plan section) Acute Rehab OT Goals Patient Stated Goal: Rehab and home to resume previous lifestyle with decreased pain OT Goal Formulation: With patient Time For Goal Achievement: 12/03/13 Potential to Achieve Goals: Good ADL Goals Pt Will Perform Grooming: with supervision;with set-up;standing Pt Will Perform Lower Body Bathing: with min assist;sitting/lateral leans;sit to/from stand Pt Will Perform Lower Body Dressing: with mod assist;with min assist;sitting/lateral leans;sit to/from stand Pt Will Transfer to Toilet: with min guard assist;with supervision;ambulating;regular height toilet;grab bars Pt Will Perform Toileting - Clothing Manipulation and hygiene: with min guard assist;sit to/from stand Additional ADL Goal #1: Pt will verbalize and demo safe technique for using RW during ADL mobility   Visit Information  Last OT Received On: 11/26/13 Assistance Needed: +1 History of Present Illness: Pt with L THA, anterior       Prior Functioning     Home Living Family/patient expects to be discharged to:: Skilled nursing facility Living Arrangements: Alone Prior Function Level of Independence: Independent Communication Communication: No difficulties Dominant Hand: Right         Vision/Perception Vision - History Baseline Vision: Wears glasses all the time Patient Visual Report: No change from  baseline Perception Perception: Within Functional Limits   Cognition  Cognition Arousal/Alertness: Awake/alert Behavior During Therapy: WFL for tasks assessed/performed Overall Cognitive Status: Within Functional Limits for tasks assessed    Extremity/Trunk Assessment Upper Extremity Assessment Upper Extremity Assessment: Overall WFL for tasks assessed Lower  Extremity Assessment Lower Extremity Assessment: Defer to PT evaluation Cervical / Trunk Assessment Cervical / Trunk Assessment: Normal     Mobility Bed Mobility Bed Mobility: Supine to Sit;Sitting - Scoot to Edge of Bed Supine to Sit: With rails;4: Min guard Sitting - Scoot to [place] of Bed: 5: Supervision Transfers Transfers: Sit to Stand;Stand to Sit Sit to Stand: 4: Min assist;From bed;From toilet Stand to Sit: To bed;To toilet;4: Min assist Details for Transfer Assistance: pt requires verbal cues to use RW, correct hand placement. Pt likes to hold on to furniture when walknig instead of using RW     Exercise     Balance Balance Balance Assessed: Yes Dynamic Sitting Balance Dynamic Sitting - Balance Support: No upper extremity supported;Feet unsupported;During functional activity Dynamic Sitting - Level of Assistance: 5: Stand by assistance Dynamic Standing Balance Dynamic Standing - Balance Support: Left upper extremity supported;No upper extremity supported;During functional activity Dynamic Standing - Level of Assistance: Other (comment) (min guard A)   End of Session OT - End of Session Equipment Utilized During Treatment: Rolling walker Activity Tolerance: Patient tolerated treatment well Patient left: in bed;Other (comment) (sitting EOB with PT)  GO     Galen Manila 11/26/2013, 3:36 PM

## 2013-11-26 NOTE — Progress Notes (Signed)
Utilization review completed.  

## 2013-11-26 NOTE — Evaluation (Signed)
Physical Therapy Evaluation Patient Details Name: Barry Morgan MRN: [id number] DOB: 04-04-1941 Today's Date: 11/26/2013 Time: 8657-8469 PT Time Calculation (min): 20 min  PT Assessment / Plan / Recommendation History of Present Illness     Clinical Impression  Pt s/p L THR presents with decreased L LE strength/ROM and post op pain limiting functional mobility.  Pt would benefit from ST SNF level rehab to maximize IND prior to d/c home alone.    PT Assessment  Patient needs continued PT services    Follow Up Recommendations  SNF    Does the patient have the potential to tolerate intense rehabilitation      Barriers to Discharge Inaccessible home environment Pt home alone with multiple flights of stairs to enter home    Equipment Recommendations       Recommendations for Other Services OT consult   Frequency 7X/week    Precautions / Restrictions Precautions Precautions: Fall Restrictions Weight Bearing Restrictions: No Other Position/Activity Restrictions: WBAT   Pertinent Vitals/Pain 2-3/10; premed, ice pack provided      Mobility  Bed Mobility Bed Mobility: Not assessed (Pt OOB and declines back to bed) Transfers Transfers: Sit to Stand;Stand to Sit Sit to Stand: 4: Min assist Stand to Sit: 4: Min assist Details for Transfer Assistance: cues for LE management and use of UEs to self assist Ambulation/Gait Ambulation/Gait Assistance: 4: Min assist Ambulation Distance (Feet): 200 Feet (twice) Assistive device: Rolling walker Ambulation/Gait Assistance Details: cues for posture and position from RW Gait Pattern: Step-to pattern;Step-through pattern;Shuffle;Antalgic;Trunk flexed Stairs: No    Exercises     PT Diagnosis: Difficulty walking  PT Problem List: Decreased strength;Decreased range of motion;Decreased activity tolerance;Decreased mobility;Decreased knowledge of use of DME;Pain PT Treatment Interventions: DME instruction;Gait training;Stair  training;Functional mobility training;Therapeutic activities;Therapeutic exercise;Patient/family education     PT Goals(Current goals can be found in the care plan section) Acute Rehab PT Goals Patient Stated Goal: Rehab and home to resume previous lifestyle with decreased pain PT Goal Formulation: With patient Time For Goal Achievement: 12/03/13 Potential to Achieve Goals: Good  Visit Information  Last PT Received On: 11/26/13 Assistance Needed: +1       Prior Functioning  Home Living Family/patient expects to be discharged to:: Skilled nursing facility Living Arrangements: Alone Prior Function Level of Independence: Independent Communication Communication: No difficulties    Cognition  Cognition Arousal/Alertness: Awake/alert Behavior During Therapy: WFL for tasks assessed/performed Overall Cognitive Status: Within Functional Limits for tasks assessed    Extremity/Trunk Assessment Upper Extremity Assessment Upper Extremity Assessment: Overall WFL for tasks assessed Lower Extremity Assessment Lower Extremity Assessment: LLE deficits/detail Cervical / Trunk Assessment Cervical / Trunk Assessment: Normal   Balance    End of Session PT - End of Session Activity Tolerance: Patient tolerated treatment well Patient left: in chair;with call bell/phone within reach Nurse Communication: Mobility status  GP     Barry Morgan 11/26/2013, 1:36 PM

## 2013-11-26 NOTE — Progress Notes (Signed)
   Subjective: 1 Day Post-Op Procedure(s) (LRB): LEFT TOTAL HIP ARTHROPLASTY ANTERIOR APPROACH (Left) Patient reports pain as mild.   Patient seen in rounds by Dr. Lequita Halt. Patient is well, and has had no acute complaints or problems We will start therapy today.  Plan is to go Skilled nursing facility after hospital stay.  Objective: Vital signs in last 24 hours: Temp:  [97.5 F (36.4 C)-98.4 F (36.9 C)] 97.9 F (36.6 C) (12/18 0532) Pulse Rate:  [58-84] 63 (12/18 0532) Resp:  [14-21] 16 (12/18 0532) BP: (103-155)/(68-86) 112/69 mmHg (12/18 0532) SpO2:  [97 %-100 %] 100 % (12/18 0532) Weight:  [99.338 kg (219 lb)] 99.338 kg (219 lb) (12/17 1447)  Intake/Output from previous day:  Intake/Output Summary (Last 24 hours) at 11/26/13 0704 Last data filed at 11/26/13 0700  Gross per 24 hour  Intake   3730 ml  Output   3465 ml  Net    265 ml    Intake/Output this shift:    Labs:  Recent Labs  11/26/13 0505  HGB 8.9*    Recent Labs  11/26/13 0505  WBC 9.0  RBC 2.91*  HCT 26.5*  PLT 170    Recent Labs  11/26/13 0505  NA 131*  K 4.6  CL 100  CO2 24  BUN 24*  CREATININE 1.06  GLUCOSE 135*  CALCIUM 7.9*   No results found for this basename: LABPT, INR,  in the last 72 hours  EXAM General - Patient is Alert, Appropriate and Oriented Extremity - Neurovascular intact Sensation intact distally Dorsiflexion/Plantar flexion intact Dressing - dressing C/D/I Motor Function - intact, moving foot and toes well on exam.  Hemovac pulled without difficulty.  Past Medical History  Diagnosis Date  . Hyperlipidemia   . Eye globe prosthesis     RIGHT  . GERD (gastroesophageal reflux disease)   . Hypertension   . Dysrhythmia   . BPH (benign prostatic hypertrophy)   . PONV (postoperative nausea and vomiting)     SICK FOR 3 DAYS AFTER COLONOSCOPY  . Coronary artery disease     DRUG ELUTING STENT DISTAL RCA 04/2012  . Shortness of breath     ONLY IF CLIMBING 2  OR 3 FLIGHTS OF STAIRS  . Arthritis     PAIN AND OA LEFT HIP    Assessment/Plan: 1 Day Post-Op Procedure(s) (LRB): LEFT TOTAL HIP ARTHROPLASTY ANTERIOR APPROACH (Left) Principal Problem:   OA (osteoarthritis) of hip  Estimated body mass index is 30.56 kg/(m^2) as calculated from the following:   Height as of this encounter: 5\' 11"  (1.803 m).   Weight as of this encounter: 99.338 kg (219 lb). Advance diet Up with therapy Discharge to SNF when bed availalbe  DVT Prophylaxis - Xarelto Weight Bearing As Tolerated left Leg Hemovac Pulled Begin Therapy   Claudell Wohler 11/26/2013, 7:04 AM

## 2013-11-26 NOTE — Care Management Note (Signed)
    Page 1 of 1   11/26/2013     12:34:12 PM   CARE MANAGEMENT NOTE 11/26/2013  Patient:  Barry, Morgan   Account Number:  [id number]  Date Initiated:  11/26/2013  Documentation initiated by:  Colleen Can  Subjective/Objective Assessment:   dx left hip arthroplasty     Action/Plan:   SNF rehab at discharge   Anticipated DC Date:  11/28/2013   Anticipated DC Plan:  SKILLED NURSING FACILITY  In-house referral  Clinical Social Worker      DC Planning Services  CM consult      Choice offered to / List presented to:             Status of service:  Completed, signed off Medicare Important Message given?  NA - LOS <3 / Initial given by admissions (If response is "NO", the following Medicare IM given date fields will be blank) Date Medicare IM given:   Date Additional Medicare IM given:    Discharge Disposition:    Per UR Regulation:    If discussed at Long Length of Stay Meetings, dates discussed:    Comments:

## 2013-11-26 NOTE — Progress Notes (Signed)
Physical Therapy Treatment Patient Details Name: Barry Morgan MRN: [id number] DOB: 05/29/1941 Today's Date: 11/26/2013 Time: 4098-1191 PT Time Calculation (min): 35 min  PT Assessment / Plan / Recommendation  History of Present Illness Pt with L THA, anterior   PT Comments     Follow Up Recommendations  SNF     Does the patient have the potential to tolerate intense rehabilitation     Barriers to Discharge        Equipment Recommendations       Recommendations for Other Services OT consult  Frequency 7X/week   Progress towards PT Goals Progress towards PT goals: Progressing toward goals  Plan Current plan remains appropriate    Precautions / Restrictions Precautions Precautions: Fall Restrictions Weight Bearing Restrictions: No Other Position/Activity Restrictions: WBAT   Pertinent Vitals/Pain 2-3/10; premed, ice pack provided    Mobility  Bed Mobility Bed Mobility: Supine to Sit;Sitting - Scoot to Edge of Bed Supine to Sit: With rails;4: Min guard Sitting - Scoot to [place] of Bed: 5: Supervision Sit to Supine: 4: Min guard Details for Bed Mobility Assistance: cues for sequence and use of R LE to self assist Transfers Transfers: Sit to Stand;Stand to Sit Sit to Stand: 4: Min assist;From bed;From toilet Stand to Sit: To bed;To toilet;4: Min assist Details for Transfer Assistance: pt requires verbal cues to use RW, correct hand placement. Pt likes to hold on to furniture when walknig instead of using RW Ambulation/Gait Ambulation/Gait Assistance: 4: Min guard Ambulation Distance (Feet): 250 Feet (twice) Assistive device: Rolling walker Ambulation/Gait Assistance Details: cues for posture and position from RW Gait Pattern: Step-to pattern;Step-through pattern;Shuffle;Antalgic;Trunk flexed Stairs: No    Exercises     PT Diagnosis:    PT Problem List:   PT Treatment Interventions:     PT Goals (current goals can now be found in the care plan section) Acute  Rehab PT Goals Patient Stated Goal: Rehab and home to resume previous lifestyle with decreased pain PT Goal Formulation: With patient Time For Goal Achievement: 12/03/13 Potential to Achieve Goals: Good  Visit Information  Last PT Received On: 11/26/13 Assistance Needed: +1 History of Present Illness: Pt with L THA, anterior    Subjective Data  Patient Stated Goal: Rehab and home to resume previous lifestyle with decreased pain   Cognition  Cognition Arousal/Alertness: Awake/alert Behavior During Therapy: WFL for tasks assessed/performed Overall Cognitive Status: Within Functional Limits for tasks assessed    Balance  Balance Balance Assessed: Yes Dynamic Sitting Balance Dynamic Sitting - Balance Support: No upper extremity supported;Feet unsupported;During functional activity Dynamic Sitting - Level of Assistance: 5: Stand by assistance Dynamic Standing Balance Dynamic Standing - Balance Support: Left upper extremity supported;No upper extremity supported;During functional activity Dynamic Standing - Level of Assistance: Other (comment) (min guard A)  End of Session PT - End of Session Equipment Utilized During Treatment: Gait belt Activity Tolerance: Patient tolerated treatment well Patient left: in bed;with call bell/phone within reach;with family/visitor present Nurse Communication: Mobility status;Other (comment) (bleeding at drain site - RN in to attend)   GP     Carver Murakami 11/26/2013, 5:23 PM

## 2013-11-26 NOTE — Plan of Care (Signed)
Problem: Consults Goal: Diagnosis- Total Joint Replacement Outcome: Completed/Met Date Met:  11/26/13 Primary Total Hip LEFT, Anterior

## 2013-11-27 LAB — CBC
HCT: 24.6 % — ABNORMAL LOW (ref 39.0–52.0)
Hemoglobin: 8.2 g/dL — ABNORMAL LOW (ref 13.0–17.0)
MCH: 30.6 pg (ref 26.0–34.0)
MCHC: 33.3 g/dL (ref 30.0–36.0)
Platelets: 139 10*3/uL — ABNORMAL LOW (ref 150–400)
RBC: 2.68 MIL/uL — ABNORMAL LOW (ref 4.22–5.81)
RDW: 14 % (ref 11.5–15.5)
WBC: 7.5 10*3/uL (ref 4.0–10.5)

## 2013-11-27 LAB — BASIC METABOLIC PANEL
BUN: 26 mg/dL — ABNORMAL HIGH (ref 6–23)
Calcium: 8.1 mg/dL — ABNORMAL LOW (ref 8.4–10.5)
Chloride: 101 mEq/L (ref 96–112)
GFR calc Af Amer: 75 mL/min — ABNORMAL LOW (ref >90)
GFR calc non Af Amer: 65 mL/min — ABNORMAL LOW (ref >90)
Glucose, Bld: 131 mg/dL — ABNORMAL HIGH (ref 70–99)
Potassium: 4.7 mEq/L (ref 3.5–5.1)

## 2013-11-27 MED ORDER — POLYETHYLENE GLYCOL 3350 17 G PO PACK: 17.0000 g | PACK | Freq: Every day | ORAL | Status: DC | PRN

## 2013-11-27 MED ORDER — ZOLPIDEM TARTRATE 5 MG PO TABS: 5.0000 mg | ORAL_TABLET | Freq: Every evening | ORAL | Status: DC | PRN

## 2013-11-27 MED ORDER — BISACODYL 10 MG RE SUPP: 10.0000 mg | Freq: Every day | RECTAL | Status: DC | PRN

## 2013-11-27 MED ORDER — RIVAROXABAN 10 MG PO TABS: 10.0000 mg | ORAL_TABLET | Freq: Every day | ORAL | Status: DC

## 2013-11-27 MED ORDER — TRAMADOL HCL 50 MG PO TABS: 50.0000 mg | ORAL_TABLET | Freq: Four times a day (QID) | ORAL | Status: DC | PRN

## 2013-11-27 MED ORDER — DSS 100 MG PO CAPS: 100.0000 mg | ORAL_CAPSULE | Freq: Two times a day (BID) | ORAL | Status: DC

## 2013-11-27 MED ORDER — OXYCODONE HCL 5 MG PO TABS: 5.0000 mg | ORAL_TABLET | ORAL | Status: DC | PRN

## 2013-11-27 MED ORDER — POLYSACCHARIDE IRON COMPLEX 150 MG PO CAPS: 150.0000 mg | ORAL_CAPSULE | Freq: Two times a day (BID) | ORAL | Status: DC

## 2013-11-27 MED ORDER — METOCLOPRAMIDE HCL 5 MG PO TABS: 5.0000 mg | ORAL_TABLET | Freq: Three times a day (TID) | ORAL | Status: DC | PRN

## 2013-11-27 MED ORDER — METHOCARBAMOL 500 MG PO TABS: 500.0000 mg | ORAL_TABLET | Freq: Four times a day (QID) | ORAL | Status: DC | PRN

## 2013-11-27 MED ORDER — ONDANSETRON HCL 4 MG PO TABS: 4.0000 mg | ORAL_TABLET | Freq: Four times a day (QID) | ORAL | Status: DC | PRN

## 2013-11-27 NOTE — Discharge Summary (Addendum)
Physician Discharge Summary   Patient ID: Barry Morgan MRN: [id number] DOB/AGE: 04-12-1941 72 y.o.  Admit date: 11/25/2013 Discharge date: Tentative Date of Discharge - Saturday 12/20/201  Primary Diagnosis:  Osteoarthritis of the Left hip.   Admission Diagnoses:  Past Medical History  Diagnosis Date  . Hyperlipidemia   . Eye globe prosthesis     RIGHT  . GERD (gastroesophageal reflux disease)   . Hypertension   . Dysrhythmia   . BPH (benign prostatic hypertrophy)   . PONV (postoperative nausea and vomiting)     SICK FOR 3 DAYS AFTER COLONOSCOPY  . Coronary artery disease     DRUG ELUTING STENT DISTAL RCA 04/2012  . Shortness of breath     ONLY IF CLIMBING 2 OR 3 FLIGHTS OF STAIRS  . Arthritis     PAIN AND OA LEFT HIP   Discharge Diagnoses:   Principal Problem:   OA (osteoarthritis) of hip Active Problems:   Hyponatremia   Postoperative anemia due to acute blood loss  Estimated body mass index is 30.56 kg/(m^2) as calculated from the following:   Height as of this encounter: 5\' 11"  (1.803 m).   Weight as of this encounter: 99.338 kg (219 lb).  Procedure(s) (LRB): LEFT TOTAL HIP ARTHROPLASTY ANTERIOR APPROACH (Left)   Consults: None  HPI: Barry Morgan is a 72 y.o. male who has advanced end-  stage arthritis of his Left hip with progressively worsening pain and  dysfunction.The patient has failed nonoperative management and presents for  total hip arthroplasty.   Laboratory Data: Admission on 11/25/2013  Component Date Value Range Status  . WBC 11/26/2013 9.0  4.0 - 10.5 K/uL Final  . RBC 11/26/2013 2.91* 4.22 - 5.81 MIL/uL Final  . Hemoglobin 11/26/2013 8.9* 13.0 - 17.0 g/dL Final  . HCT 62/13/0865 26.5* 39.0 - 52.0 % Final  . MCV 11/26/2013 91.1  78.0 - 100.0 fL Final  . MCH 11/26/2013 30.6  26.0 - 34.0 pg Final  . MCHC 11/26/2013 33.6  30.0 - 36.0 g/dL Final  . RDW 78/46/9629 13.8  11.5 - 15.5 % Final  . Platelets 11/26/2013 170  150 - 400 K/uL Final  .  Sodium 11/26/2013 131* 135 - 145 mEq/L Final  . Potassium 11/26/2013 4.6  3.5 - 5.1 mEq/L Final  . Chloride 11/26/2013 100  96 - 112 mEq/L Final  . CO2 11/26/2013 24  19 - 32 mEq/L Final  . Glucose, Bld 11/26/2013 135* 70 - 99 mg/dL Final  . BUN 52/84/1324 24* 6 - 23 mg/dL Final  . Creatinine, Ser 11/26/2013 1.06  0.50 - 1.35 mg/dL Final  . Calcium 40/09/2724 7.9* 8.4 - 10.5 mg/dL Final  . GFR calc non Af Amer 11/26/2013 68* >90 mL/min Final  . GFR calc Af Amer 11/26/2013 79* >90 mL/min Final   Comment: (NOTE)                          The eGFR has been calculated using the CKD EPI equation.                          This calculation has not been validated in all clinical situations.                          eGFR's persistently <90 mL/min signify possible Chronic Kidney  Disease.  . WBC 11/27/2013 7.5  4.0 - 10.5 K/uL Final  . RBC 11/27/2013 2.68* 4.22 - 5.81 MIL/uL Final  . Hemoglobin 11/27/2013 8.2* 13.0 - 17.0 g/dL Final  . HCT 14/78/2956 24.6* 39.0 - 52.0 % Final  . MCV 11/27/2013 91.8  78.0 - 100.0 fL Final  . MCH 11/27/2013 30.6  26.0 - 34.0 pg Final  . MCHC 11/27/2013 33.3  30.0 - 36.0 g/dL Final  . RDW 21/30/8657 14.0  11.5 - 15.5 % Final  . Platelets 11/27/2013 139* 150 - 400 K/uL Final  . Sodium 11/27/2013 134* 135 - 145 mEq/L Final  . Potassium 11/27/2013 4.7  3.5 - 5.1 mEq/L Final  . Chloride 11/27/2013 101  96 - 112 mEq/L Final  . CO2 11/27/2013 25  19 - 32 mEq/L Final  . Glucose, Bld 11/27/2013 131* 70 - 99 mg/dL Final  . BUN 84/69/6295 26* 6 - 23 mg/dL Final  . Creatinine, Ser 11/27/2013 1.10  0.50 - 1.35 mg/dL Final  . Calcium 28/41/3244 8.1* 8.4 - 10.5 mg/dL Final  . GFR calc non Af Amer 11/27/2013 65* >90 mL/min Final  . GFR calc Af Amer 11/27/2013 75* >90 mL/min Final   Comment: (NOTE)                          The eGFR has been calculated using the CKD EPI equation.                          This calculation has not been validated in all  clinical situations.                          eGFR's persistently <90 mL/min signify possible Chronic Kidney                          Disease.  Hospital Outpatient Visit on 11/19/2013  Component Date Value Range Status  . MRSA, PCR 11/19/2013 NEGATIVE  NEGATIVE Final  . Staphylococcus aureus 11/19/2013 NEGATIVE  NEGATIVE Final   Comment:                                 The Xpert SA Assay (FDA                          approved for NASAL specimens                          in patients over 17 years of age),                          is one component of                          a comprehensive surveillance                          program.  Test performance has                          been validated by [employer]  [facility] for patients greater                          than or equal to 87 year old.                          It is not intended                          to diagnose infection nor to                          guide or monitor treatment.  Marland Kitchen aPTT 11/19/2013 29  24 - 37 seconds Final  . WBC 11/19/2013 6.0  4.0 - 10.5 K/uL Final  . RBC 11/19/2013 4.45  4.22 - 5.81 MIL/uL Final  . Hemoglobin 11/19/2013 13.7  13.0 - 17.0 g/dL Final  . HCT 47/82/9562 40.4  39.0 - 52.0 % Final  . MCV 11/19/2013 90.8  78.0 - 100.0 fL Final  . MCH 11/19/2013 30.8  26.0 - 34.0 pg Final  . MCHC 11/19/2013 33.9  30.0 - 36.0 g/dL Final  . RDW 13/07/6577 13.7  11.5 - 15.5 % Final  . Platelets 11/19/2013 201  150 - 400 K/uL Final  . Sodium 11/19/2013 136  135 - 145 mEq/L Final  . Potassium 11/19/2013 4.5  3.5 - 5.1 mEq/L Final  . Chloride 11/19/2013 100  96 - 112 mEq/L Final  . CO2 11/19/2013 25  19 - 32 mEq/L Final  . Glucose, Bld 11/19/2013 101* 70 - 99 mg/dL Final  . BUN 46/96/2952 27* 6 - 23 mg/dL Final  . Creatinine, Ser 11/19/2013 1.25  0.50 - 1.35 mg/dL Final  . Calcium 84/13/2440 9.6  8.4 - 10.5 mg/dL Final  . Total Protein 11/19/2013 7.3  6.0 - 8.3 g/dL Final  . Albumin 10/06/2535  4.2  3.5 - 5.2 g/dL Final  . AST 64/40/3474 26  0 - 37 U/L Final  . ALT 11/19/2013 34  0 - 53 U/L Final  . Alkaline Phosphatase 11/19/2013 66  39 - 117 U/L Final  . Total Bilirubin 11/19/2013 0.5  0.3 - 1.2 mg/dL Final  . GFR calc non Af Amer 11/19/2013 56* >90 mL/min Final  . GFR calc Af Amer 11/19/2013 65* >90 mL/min Final   Comment: (NOTE)                          The eGFR has been calculated using the CKD EPI equation.                          This calculation has not been validated in all clinical situations.                          eGFR's persistently <90 mL/min signify possible Chronic Kidney                          Disease.  Marland Kitchen Prothrombin Time 11/19/2013 12.1  11.6 - 15.2 seconds Final  . INR 11/19/2013 0.91  0.00 - 1.49 Final  . ABO/RH(D) 11/19/2013 B POS   Final  . Antibody Screen 11/19/2013 NEG   Final  . Sample Expiration 11/19/2013 11/28/2013   Final  . Color,  Urine 11/19/2013 YELLOW  YELLOW Final  . APPearance 11/19/2013 CLEAR  CLEAR Final  . Specific Gravity, Urine 11/19/2013 1.018  1.005 - 1.030 Final  . pH 11/19/2013 5.5  5.0 - 8.0 Final  . Glucose, UA 11/19/2013 NEGATIVE  NEGATIVE mg/dL Final  . Hgb urine dipstick 11/19/2013 NEGATIVE  NEGATIVE Final  . Bilirubin Urine 11/19/2013 NEGATIVE  NEGATIVE Final  . Ketones, ur 11/19/2013 NEGATIVE  NEGATIVE mg/dL Final  . Protein, ur 16/09/9603 NEGATIVE  NEGATIVE mg/dL Final  . Urobilinogen, UA 11/19/2013 0.2  0.0 - 1.0 mg/dL Final  . Nitrite 54/08/8118 NEGATIVE  NEGATIVE Final  . Leukocytes, UA 11/19/2013 NEGATIVE  NEGATIVE Final   MICROSCOPIC NOT DONE ON URINES WITH NEGATIVE PROTEIN, BLOOD, LEUKOCYTES, NITRITE, OR GLUCOSE <1000 mg/dL.  . ABO/RH(D) 11/19/2013 B POS   Final     X-Rays:Dg Chest 2 View  11/19/2013   CLINICAL DATA:  Coronary artery disease.  EXAM: CHEST  2 VIEW  COMPARISON:  None.  FINDINGS: The heart size and mediastinal contours are within normal limits. There is no focal infiltrate, pulmonary edema, or  pleural effusion. There is scoliosis of spine and degenerative joint changes of spine. Chronic deformity of the right acromioclavicular joint is noted.  IMPRESSION: No active cardiopulmonary disease.   Electronically Signed   By: Sherian Rein M.D.   On: 11/19/2013 14:01   Dg Hip Complete Left  11/19/2013   CLINICAL DATA:  Preop for hip surgery.  EXAM: LEFT HIP - COMPLETE 2+ VIEW  COMPARISON:  None.  FINDINGS: There is no evidence of hip fracture or dislocation. There is marked narrow left hip joint space with chronic deformity of the left femoral head. Osteophytes are noted extending from the left acetabulum.  IMPRESSION: Chronic changes of left hip.  No acute fracture or dislocation.   Electronically Signed   By: Sherian Rein M.D.   On: 11/19/2013 13:56   Dg Pelvis Portable  11/25/2013   CLINICAL DATA:  Left hip replacement.  EXAM: PORTABLE PELVIS 1-2 VIEWS  COMPARISON:  Left hip series 12 10/2013.  FINDINGS: Patient status post total left hip replacement. Good anatomic alignment noted on one view. Surgical drainage catheter noted over the left hip. Stable calcific densities noted adjacent to the left hip. These could represent loose bodies. No acute bony abnormality.  IMPRESSION: Patient status post total left hip replacement with good anatomic alignment.   Electronically Signed   By: Maisie Fus  Register   On: 11/25/2013 14:17   Dg C-arm 61-120 Min-no Report  11/25/2013   CLINICAL DATA: laft anterior hip   C-ARM 61-120 MINUTES  Fluoroscopy was utilized by the requesting physician.  No radiographic  interpretation.     EKG:No orders found for this or any previous visit.   Hospital Course: Patient was admitted to [facility] and taken to the OR and underwent the above state procedure without complications.  Patient tolerated the procedure well and was later transferred to the recovery room and then to the orthopaedic floor for postoperative care.  They were given PO and IV analgesics for  pain control following their surgery.  They were given 24 hours of postoperative antibiotics of  Anti-infectives   Start     Dose/Rate Route Frequency Ordered Stop   11/25/13 1700  ceFAZolin (ANCEF) IVPB 2 g/50 mL premix     2 g 100 mL/hr over 30 Minutes Intravenous Every 6 hours 11/25/13 1507 11/26/13 0038   11/25/13 0830  ceFAZolin (ANCEF) IVPB 2 g/50 mL  premix     2 g 100 mL/hr over 30 Minutes Intravenous On call to O.R. 11/25/13 0827 11/25/13 1112     and started on DVT prophylaxis in the form of Xarelto and continued the baby Aspirin.  His home Plavix was placed on hold while on the Xarelto but will resume when finishes his three week protocol of Xarelto   PT and OT were ordered for total hip protocol.  The patient was allowed to be WBAT with therapy. Discharge planning was consulted to help with postop disposition and equipment needs.  Patient had a good night on the evening of surgery.  They started to get up OOB with therapy on day one.  Hemovac drain was pulled without difficulty. He did have some nausea on the afternoon of day one but resolved with treatment by the next day.  Continued to work with therapy into day two.  Dressing was changed on day two and the incision was healing well.  He did have some bruising but nothing out of the ordinary.  Patient was seen in rounds on day two and was doing well.  It was felt that as long as the patient continued to improve, they would be ready to transfer to the SNF the following day, Saturday 11/10/2013.  The patient will evaluated by the weekend coverage staff and will discharge if doing well.  This summary was prepared in anticipation of the the patient's transfer over the weekend.   Discharge Medications: Prior to Admission medications   Medication Sig Start Date End Date Taking? Authorizing Provider  amiodarone (PACERONE) 200 MG tablet Take 200 mg by mouth at bedtime.   Yes Historical Provider, MD  aspirin 81 MG tablet Take 81 mg by mouth  daily.   Yes Historical Provider, MD  atorvastatin (LIPITOR) 40 MG tablet Take 40 mg by mouth at bedtime.   Yes Historical Provider, MD  clopidogrel (PLAVIX) 75 MG tablet Take 75 mg by mouth at bedtime.   Yes Historical Provider, MD  glucosamine-chondroitin 500-400 MG tablet Take 1 tablet by mouth daily.   Yes Historical Provider, MD  lisinopril (PRINIVIL,ZESTRIL) 5 MG tablet Take 5 mg by mouth at bedtime.   Yes Historical Provider, MD  meloxicam (MOBIC) 7.5 MG tablet Take 7.5 mg by mouth daily.   Yes Historical Provider, MD  Multiple Vitamin (MULTIVITAMIN) capsule Take 1 capsule by mouth daily.   Yes Historical Provider, MD  pantoprazole (PROTONIX) 40 MG tablet Take 40 mg by mouth daily. TAKES IN AM   Yes Historical Provider, MD  tamsulosin (FLOMAX) 0.4 MG CAPS capsule Take 0.4 mg by mouth daily after supper.   Yes Historical Provider, MD  Acetaminophen (TYLENOL PO) Take by mouth. IF NEEDED 500 MG FOR PAIN    Historical Provider, MD  bisacodyl (DULCOLAX) 10 MG suppository Place 1 suppository (10 mg total) rectally daily as needed for moderate constipation. 11/27/13   Alexzandrew Perkins, PA-C  docusate sodium 100 MG CAPS Take 100 mg by mouth 2 (two) times daily. 11/27/13   Alexzandrew Julien Girt, PA-C  iron polysaccharides (NIFEREX) 150 MG capsule Take 1 capsule (150 mg total) by mouth 2 (two) times daily. 11/27/13   Alexzandrew Julien Girt, PA-C  methocarbamol (ROBAXIN) 500 MG tablet Take 1 tablet (500 mg total) by mouth every 6 (six) hours as needed for muscle spasms. 11/27/13   Alexzandrew Julien Girt, PA-C  metoCLOPramide (REGLAN) 5 MG tablet Take 1-2 tablets (5-10 mg total) by mouth every 8 (eight) hours as needed for nausea (if ondansetron (  ZOFRAN) ineffective.). 11/27/13   Alexzandrew Perkins, PA-C  ondansetron (ZOFRAN) 4 MG tablet Take 1 tablet (4 mg total) by mouth every 6 (six) hours as needed for nausea. 11/27/13   Alexzandrew Perkins, PA-C  oxyCODONE (OXY IR/ROXICODONE) 5 MG immediate release  tablet Take 1-2 tablets (5-10 mg total) by mouth every 3 (three) hours as needed for breakthrough pain. 11/27/13   Alexzandrew Perkins, PA-C  polyethylene glycol (MIRALAX / GLYCOLAX) packet Take 17 g by mouth daily as needed for mild constipation. 11/27/13   Alexzandrew Julien Girt, PA-C  rivaroxaban (XARELTO) 10 MG TABS tablet Take 1 tablet (10 mg total) by mouth daily with breakfast. Take Xarelto for two and a half more weeks, then discontinue Xarelto. Once the patient has completed the Xarelto, they may resume their Plavix 75 mg daily and continue the 81 mg Aspirin. 11/27/13   Alexzandrew Perkins, PA-C  traMADol (ULTRAM) 50 MG tablet Take 1-2 tablets (50-100 mg total) by mouth every 6 (six) hours as needed (mild pain). 11/27/13   Alexzandrew Julien Girt, PA-C  zolpidem (AMBIEN) 5 MG tablet Take 1 tablet (5 mg total) by mouth at bedtime as needed for sleep. 11/27/13   Alexzandrew Julien Girt, PA-C    Diet: Cardiac diet Activity:WBAT Follow-up:in 2 weeks Disposition - Skilled nursing facility Discharged Condition: Pending at time of summary, Transfer on Saturday if doing well on weekend rounds.   Discharge Orders   Future Orders Complete By Expires   Call MD / Call 911  As directed    Comments:     If you experience chest pain or shortness of breath, CALL 911 and be transported to the hospital emergency room.  If you develope a fever above 101 F, pus (white drainage) or increased drainage or redness at the wound, or calf pain, call your surgeon's office.   Change dressing  As directed    Comments:     You may change your dressing dressing daily with sterile 4 x 4 inch gauze dressing and paper tape.  Do not submerge the incision under water.   Constipation Prevention  As directed    Comments:     Drink plenty of fluids.  Prune juice may be helpful.  You may use a stool softener, such as Colace (over the counter) 100 mg twice a day.  Use MiraLax (over the counter) for constipation as needed.   Diet -  low sodium heart healthy  As directed    Discharge instructions  As directed    Comments:     Pick up stool softner and laxative for home. Do not submerge incision under water. May shower. Continue to use ice for pain and swelling from surgery.  Total Hip Protocol.  Take Xarelto for two and a half more weeks, then discontinue Xarelto. Once the patient has completed the Xarelto, they may resume their  Plavix 75 mg and continue the 81 mg Aspirin.  When discharged from the skilled rehab facility, please have the facility set up the patient's Home Health Physical Therapy prior to being released.  Also provide the patient with their medications at time of release from the facility to include their pain medication, the muscle relaxants, and their blood thinner medication.  If the patient is still at the rehab facility at time of follow up appointment, please also assist the patient in arranging follow up appointment in our office and any transportation needs.   Do not sit on low chairs, stoools or toilet seats, as it may be difficult to get  up from low surfaces  As directed    Driving restrictions  As directed    Comments:     No driving until released by the physician.   Increase activity slowly as tolerated  As directed    Lifting restrictions  As directed    Comments:     No lifting until released by the physician.   Patient may shower  As directed    Comments:     You may shower without a dressing once there is no drainage.  Do not wash over the wound.  If drainage remains, do not shower until drainage stops.   TED hose  As directed    Comments:     Use stockings (TED hose) for 3 weeks on both leg(s).  You may remove them at night for sleeping.   Weight bearing as tolerated  As directed    Questions:     Laterality:     Extremity:         Medication List    STOP taking these medications       clopidogrel 75 MG tablet  Commonly known as:  PLAVIX is on hold until the patient  finishes the three week protocol of Xarelto.     glucosamine-chondroitin 500-400 MG tablet     meloxicam 7.5 MG tablet  Commonly known as:  MOBIC     multivitamin capsule      TAKE these medications       amiodarone 200 MG tablet  Commonly known as:  PACERONE  Take 200 mg by mouth at bedtime.     aspirin 81 MG tablet  Take 81 mg by mouth daily.     atorvastatin 40 MG tablet  Commonly known as:  LIPITOR  Take 40 mg by mouth at bedtime.     bisacodyl 10 MG suppository  Commonly known as:  DULCOLAX  Place 1 suppository (10 mg total) rectally daily as needed for moderate constipation.     DSS 100 MG Caps  Take 100 mg by mouth 2 (two) times daily.     iron polysaccharides 150 MG capsule  Commonly known as:  NIFEREX  Take 1 capsule (150 mg total) by mouth 2 (two) times daily.     lisinopril 5 MG tablet  Commonly known as:  PRINIVIL,ZESTRIL  Take 5 mg by mouth at bedtime.     methocarbamol 500 MG tablet  Commonly known as:  ROBAXIN  Take 1 tablet (500 mg total) by mouth every 6 (six) hours as needed for muscle spasms.     metoCLOPramide 5 MG tablet  Commonly known as:  REGLAN  Take 1-2 tablets (5-10 mg total) by mouth every 8 (eight) hours as needed for nausea (if ondansetron (ZOFRAN) ineffective.).     ondansetron 4 MG tablet  Commonly known as:  ZOFRAN  Take 1 tablet (4 mg total) by mouth every 6 (six) hours as needed for nausea.     oxyCODONE 5 MG immediate release tablet  Commonly known as:  Oxy IR/ROXICODONE  Take 1-2 tablets (5-10 mg total) by mouth every 3 (three) hours as needed for breakthrough pain.     pantoprazole 40 MG tablet  Commonly known as:  PROTONIX  Take 40 mg by mouth daily. TAKES IN AM     polyethylene glycol packet  Commonly known as:  MIRALAX / GLYCOLAX  Take 17 g by mouth daily as needed for mild constipation.     rivaroxaban 10 MG Tabs tablet  Commonly known  as:  XARELTO  - Take 1 tablet (10 mg total) by mouth daily with breakfast.  Take Xarelto for two and a half more weeks, then discontinue Xarelto.  - Once the patient has completed the Xarelto, they may resume their Plavix 75 mg daily and continue the 81 mg Aspirin.     tamsulosin 0.4 MG Caps capsule  Commonly known as:  FLOMAX  Take 0.4 mg by mouth daily after supper.     traMADol 50 MG tablet  Commonly known as:  ULTRAM  Take 1-2 tablets (50-100 mg total) by mouth every 6 (six) hours as needed (mild pain).     TYLENOL PO  Take by mouth. IF NEEDED 500 MG FOR PAIN     zolpidem 5 MG tablet  Commonly known as:  AMBIEN  Take 1 tablet (5 mg total) by mouth at bedtime as needed for sleep.           Follow-up Information   Follow up with Loanne Drilling, MD. Schedule an appointment as soon as possible for a visit in 2 weeks.   Specialty:  Orthopedic Surgery   Contact information:   [address] [place] [address] [phone]       Signed: Patrica Duel 11/27/2013, 7:48 AM   Addendum- The patient had symptomatic anemia with Hb of 7.3 and was transfused 2 units PRBCs on 12/20. He will be ready for discharge post-transfusion.

## 2013-11-27 NOTE — Progress Notes (Signed)
CSW assisting with d/c planning. D/C Summary has been sent to [facility] for tentative d/c SAT. Weekend CSW will assist with d/c planning to SNF.  Cori Razor LCSW [phone]

## 2013-11-27 NOTE — Progress Notes (Signed)
Occupational Therapy Treatment Patient Details Name: Barry Morgan MRN: [id number] DOB: 1941-09-22 Today's Date: 11/27/2013 Time: 8657-8469 OT Time Calculation (min): 27 min  OT Assessment / Plan / Recommendation  History of present illness Pt with L THA, anterior   OT comments  Pt worked on LB dressing and was able to don/doff L sock without AE from a lower surface today but with some effort. Worked on safety with functional transfers.    Follow Up Recommendations  SNF;Supervision/Assistance - 24 hour    Barriers to Discharge       Equipment Recommendations  None recommended by OT    Recommendations for Other Services    Frequency Min 2X/week   Progress towards OT Goals Progress towards OT goals: Progressing toward goals  Plan Discharge plan remains appropriate    Precautions / Restrictions Precautions Precautions: Fall Restrictions Weight Bearing Restrictions: No Other Position/Activity Restrictions: WBAT   Pertinent Vitals/Pain 6/10 L hip; reposition, rest, nursing made aware.    ADL  Lower Body Dressing: Min guard Where Assessed - Lower Body Dressing: Unsupported sitting Toilet Transfer: Simulated;Min guard Toilet Transfer Method: Stand pivot (several steps across room to lower chair to practice LB dressing) Equipment Used: Rolling walker ADL Comments: Pt tends to move quickly at times and needs frequent verbal cues for safety with hand placement and for backing up to surface completely before sitting down. He attempted to sit on lower chair across room before fully backed up to seat. He also tends to pull up using walker despite several cues for hand placement. Pt unable to don/doff L sock from higher recliner but when he sat on lower chair in room he was able to don L sock but with some effort. Discussed AE options and coverage but encouraged pt to continue to work toward reaching to L foot for increased flexibility and it should get easier to do. Pt planning SNF.     OT Diagnosis:    OT Problem List:   OT Treatment Interventions:     OT Goals(current goals can now be found in the care plan section)    Visit Information  Last OT Received On: 11/27/13 Assistance Needed: +1 History of Present Illness: Pt with L THA, anterior    Subjective Data      Prior Functioning       Cognition  Cognition Arousal/Alertness: Awake/alert Behavior During Therapy: WFL for tasks assessed/performed (although moves quickly at times) Overall Cognitive Status: Within Functional Limits for tasks assessed    Mobility  Transfers Transfers: Sit to Stand;Stand to Sit Sit to Stand: 4: Min guard;From bed;From chair/3-in-1;With upper extremity assist Stand to Sit: 4: Min guard;With upper extremity assist;To chair/3-in-1 Details for Transfer Assistance: verbal cues for hand placement, not to pull up on walker    Exercises      Balance     End of Session OT - End of Session Equipment Utilized During Treatment: Rolling walker Activity Tolerance: Patient tolerated treatment well Patient left: in chair;with call bell/phone within reach  GO     Lennox Laity [phone] 11/27/2013, 11:04 AM

## 2013-11-27 NOTE — Progress Notes (Signed)
   Subjective: 2 Days Post-Op Procedure(s) (LRB): LEFT TOTAL HIP ARTHROPLASTY ANTERIOR APPROACH (Left) Patient reports pain as mild.   Patient seen in rounds with Dr. Lequita Halt.  He is sitting up in the chair eating breakfast. Patient is well, and has had no acute complaints or problems We will start therapy today.  Plan is to go Skilled nursing facility after hospital stay.  Objective: Vital signs in last 24 hours: Temp:  [97.5 F (36.4 C)-98.6 F (37 C)] 97.7 F (36.5 C) (12/19 0435) Pulse Rate:  [57-76] 76 (12/19 0435) Resp:  [16] 16 (12/19 0435) BP: (136-159)/(72-79) 159/79 mmHg (12/19 0435) SpO2:  [92 %-98 %] 92 % (12/19 0435)  Intake/Output from previous day:  Intake/Output Summary (Last 24 hours) at 11/27/13 0730 Last data filed at 11/27/13 0435  Gross per 24 hour  Intake 1915.83 ml  Output   2025 ml  Net -109.17 ml    Intake/Output this shift:    Labs:  Recent Labs  11/26/13 0505 11/27/13 0542  HGB 8.9* 8.2*    Recent Labs  11/26/13 0505 11/27/13 0542  WBC 9.0 7.5  RBC 2.91* 2.68*  HCT 26.5* 24.6*  PLT 170 139*    Recent Labs  11/26/13 0505 11/27/13 0542  NA 131* 134*  K 4.6 4.7  CL 100 101  CO2 24 25  BUN 24* 26*  CREATININE 1.06 1.10  GLUCOSE 135* 131*  CALCIUM 7.9* 8.1*   No results found for this basename: LABPT, INR,  in the last 72 hours  EXAM General - Patient is Alert, Appropriate and Oriented Extremity - Neurovascular intact Sensation intact distally some bruising around the lower portion of the incision Dressing - dressing C/D/I Motor Function - intact, moving foot and toes well on exam.   Past Medical History  Diagnosis Date  . Hyperlipidemia   . Eye globe prosthesis     RIGHT  . GERD (gastroesophageal reflux disease)   . Hypertension   . Dysrhythmia   . BPH (benign prostatic hypertrophy)   . PONV (postoperative nausea and vomiting)     SICK FOR 3 DAYS AFTER COLONOSCOPY  . Coronary artery disease     DRUG ELUTING  STENT DISTAL RCA 04/2012  . Shortness of breath     ONLY IF CLIMBING 2 OR 3 FLIGHTS OF STAIRS  . Arthritis     PAIN AND OA LEFT HIP    Assessment/Plan: 2 Days Post-Op Procedure(s) (LRB): LEFT TOTAL HIP ARTHROPLASTY ANTERIOR APPROACH (Left) Principal Problem:   OA (osteoarthritis) of hip Active Problems:   Hyponatremia   Postoperative anemia due to acute blood loss  Estimated body mass index is 30.56 kg/(m^2) as calculated from the following:   Height as of this encounter: 5\' 11"  (1.803 m).   Weight as of this encounter: 99.338 kg (219 lb). Up with therapy Plan for discharge tomorrow Discharge to SNF  DVT Prophylaxis - Arixtra Weight Bearing As Tolerated left Leg   Stavros Cail 11/27/2013, 7:30 AM

## 2013-11-27 NOTE — Progress Notes (Signed)
Physical Therapy Treatment Patient Details Name: Barry Morgan MRN: [id number] DOB: 08-Dec-1941 Today's Date: 11/27/2013 Time: 1100-1135 PT Time Calculation (min): 35 min  PT Assessment / Plan / Recommendation  History of Present Illness Pt with L THA, anterior   PT Comments     Follow Up Recommendations        Does the patient have the potential to tolerate intense rehabilitation     Barriers to Discharge        Equipment Recommendations       Recommendations for Other Services    Frequency     Progress towards PT Goals    Plan      Precautions / Restrictions Precautions Precautions: Fall Restrictions Weight Bearing Restrictions: No Other Position/Activity Restrictions: WBAT   Pertinent Vitals/Pain 3-4/10; premed, ice pack provided    Mobility  Bed Mobility Bed Mobility: Sit to Supine Sit to Supine: 4: Min guard Details for Bed Mobility Assistance: cues for sequence and use of R LE to self assist Transfers Transfers: Sit to Stand;Stand to Sit Sit to Stand: 4: Min guard;From bed;From chair/3-in-1;With upper extremity assist Stand to Sit: 4: Min guard;With upper extremity assist;To chair/3-in-1 Details for Transfer Assistance: verbal cues for hand placement, not to pull up on walker Ambulation/Gait Ambulation/Gait Assistance: 4: Min assist;4: Min guard Ambulation Distance (Feet): 175 Feet (twice) Assistive device: Rolling walker Ambulation/Gait Assistance Details: cues for posture and position from RW Gait Pattern: Step-through pattern;Decreased step length - right;Decreased step length - left;Shuffle;Trunk flexed Stairs: Yes Stairs Assistance: 4: Min assist Stairs Assistance Details (indicate cue type and reason): cues for sequence and foot placement Stair Management Technique: One rail Right;Forwards;Step to pattern Number of Stairs: 4    Exercises Total Joint Exercises Ankle Circles/Pumps: AROM;Both;15 reps;Supine Quad Sets: AROM;10 reps;Supine;Both Heel  Slides: AAROM;20 reps;Supine;Left Hip ABduction/ADduction: AAROM;Left;20 reps;Supine   PT Diagnosis:    PT Problem List:   PT Treatment Interventions:     PT Goals (current goals can now be found in the care plan section) Acute Rehab PT Goals Patient Stated Goal: Rehab and home to resume previous lifestyle with decreased pain  Visit Information  Last PT Received On: 11/27/13 Assistance Needed: +1 History of Present Illness: Pt with L THA, anterior    Subjective Data  Subjective: I'm home by myself and have a lot of stairs to climb Patient Stated Goal: Rehab and home to resume previous lifestyle with decreased pain   Cognition  Cognition Arousal/Alertness: Awake/alert Behavior During Therapy: WFL for tasks assessed/performed (although moves quickly at times) Overall Cognitive Status: Within Functional Limits for tasks assessed    Balance     End of Session     GP     Shaylyn Bawa 11/27/2013, 11:47 AM

## 2013-11-28 LAB — CBC
HCT: 22.2 % — ABNORMAL LOW (ref 39.0–52.0)
MCH: 30.5 pg (ref 26.0–34.0)
MCHC: 32.9 g/dL (ref 30.0–36.0)
Platelets: 159 10*3/uL (ref 150–400)
RDW: 14.4 % (ref 11.5–15.5)

## 2013-11-28 MED ORDER — FLEET ENEMA 7-19 GM/118ML RE ENEM
1.0000 | ENEMA | Freq: Once | RECTAL | Status: AC
  Administered 2013-11-28: 1 via RECTAL
  Filled 2013-11-28: qty 1

## 2013-11-28 NOTE — Progress Notes (Signed)
   Subjective: 3 Days Post-Op Procedure(s) (LRB): LEFT TOTAL HIP ARTHROPLASTY ANTERIOR APPROACH (Left) Patient reports pain as mild.   Patient is feeling weak but has done extremely well with PT. Gets dyspnea on exertion but oxygen sats remain normal Plan is to go Skilled nursing facility after hospital stay.  Objective: Vital signs in last 24 hours: Temp:  [98 F (36.7 C)-98.4 F (36.9 C)] 98 F (36.7 C) (12/20 0516) Pulse Rate:  [79-104] 104 (12/20 0516) Resp:  [16-20] 20 (12/20 0516) BP: (132-188)/(66-84) 188/84 mmHg (12/20 0516) SpO2:  [95 %-97 %] 95 % (12/20 0516)  Intake/Output from previous day:  Intake/Output Summary (Last 24 hours) at 11/28/13 0724 Last data filed at 11/28/13 0500  Gross per 24 hour  Intake   3070 ml  Output      0 ml  Net   3070 ml    Intake/Output this shift:    Labs:  Recent Labs  11/26/13 0505 11/27/13 0542 11/28/13 0559  HGB 8.9* 8.2* 7.3*    Recent Labs  11/27/13 0542 11/28/13 0559  WBC 7.5 7.1  RBC 2.68* 2.39*  HCT 24.6* 22.2*  PLT 139* 159    Recent Labs  11/26/13 0505 11/27/13 0542  NA 131* 134*  K 4.6 4.7  CL 100 101  CO2 24 25  BUN 24* 26*  CREATININE 1.06 1.10  GLUCOSE 135* 131*  CALCIUM 7.9* 8.1*   No results found for this basename: LABPT, INR,  in the last 72 hours  EXAM General - Patient is Alert, Appropriate and Oriented Extremity - Neurologically intact Neurovascular intact No cellulitis present Compartment soft Moderate bruising left hip with small amount bloody drainage from previous hemovac site. Motor Function - intact, moving foot and toes well on exam.   Past Medical History  Diagnosis Date  . Hyperlipidemia   . Eye globe prosthesis     RIGHT  . GERD (gastroesophageal reflux disease)   . Hypertension   . Dysrhythmia   . BPH (benign prostatic hypertrophy)   . PONV (postoperative nausea and vomiting)     SICK FOR 3 DAYS AFTER COLONOSCOPY  . Coronary artery disease     DRUG ELUTING  STENT DISTAL RCA 04/2012  . Shortness of breath     ONLY IF CLIMBING 2 OR 3 FLIGHTS OF STAIRS  . Arthritis     PAIN AND OA LEFT HIP    Assessment/Plan: 3 Days Post-Op Procedure(s) (LRB): LEFT TOTAL HIP ARTHROPLASTY ANTERIOR APPROACH (Left) Principal Problem:   OA (osteoarthritis) of hip Active Problems:   Hyponatremia   Postoperative anemia due to acute blood loss  Transfuse 2 units PRBCs for symptomatic blood loss anemia Discharge to SNF after transfusion if possible to still go today  DVT Prophylaxis - Xarelto Weight Bearing As Tolerated left Leg  Barry Morgan V 11/28/2013, 7:24 AM

## 2013-11-28 NOTE — Progress Notes (Signed)
2 units of blood completed. Pt. Tolerated well, no reaction noted. Pt. Denies complaints. Report called to Rulon Eisenmenger at [facility]. They have pt. Medications and are willing to take him tonight. [facility] called for transportation.

## 2013-11-28 NOTE — Progress Notes (Signed)
Physical Therapy Treatment Patient Details Name: Barry Morgan MRN: [id number] DOB: Mar 25, 1941 Today's Date: 11/28/2013 Time:  -     PT Assessment / Plan / Recommendation  History of Present Illness Pt with L THA, anterior HgB 7.3 this am   PT Comments   POD # 3 am session of TE's only due to HgB 7.3 awaiting blood transfusion.  Assisted pt back to bed as he was standing in doorway w/o assistance.  Redirected back to bed to perform TE's. Deferred amb due to low HgB even though pt is asymp.   Follow Up Recommendations  SNF ([facility])     Does the patient have the potential to tolerate intense rehabilitation     Barriers to Discharge        Equipment Recommendations       Recommendations for Other Services    Frequency 7X/week   Progress towards PT Goals Progress towards PT goals: Progressing toward goals  Plan      Precautions / Restrictions Restrictions Weight Bearing Restrictions: No    Pertinent Vitals/Pain No c/o pain Swelling ICE applied    Mobility  Bed Mobility Bed Mobility: Sit to Supine Sit to Supine: 4: Min guard;5: Supervision Details for Bed Mobility Assistance: Pt impulsive, VC's for safety Transfers Transfers: Stand to Sit Stand to Sit: 5: Supervision;4: Min guard Details for Transfer Assistance: verbal cues for hand placement and safety with turns Ambulation/Gait Ambulation/Gait Assistance: 4: Min guard Ambulation Distance (Feet): 5 Feet Assistive device: Rolling walker Ambulation/Gait Assistance Details: decreased amb distance 2nd low HgB awaiting blood tarnsfusion Gait Pattern: Step-through pattern;Decreased step length - right;Decreased step length - left;Shuffle;Trunk flexed    Exercises   Total Hip Replacement TE's 10 reps ankle pumps 10 reps knee presses 10 reps heel slides 10 reps SAQ's 10 reps ABD Followed by ICE     PT Goals (current goals can now be found in the care plan section)    Visit Information  Last PT Received  On: 11/28/13 Assistance Needed: +1 History of Present Illness: Pt with L THA, anterior HgB 7.3 this am    Subjective Data      Cognition       Balance     End of Session PT - End of Session Equipment Utilized During Treatment: Gait belt Activity Tolerance: Patient tolerated treatment well Patient left: in chair   Felecia Shelling  PTA [facility]  Acute  Rehab Pager      [phone]

## 2013-11-28 NOTE — Clinical Social Work Note (Signed)
CSW will assist with dc to [facility]  Report # [id number].  Please contact CSW once medical staff is ready for pt to transfer.  CSW will arrange [facility] for transportation.  Vickii Penna, LCSWA [phone]  Clinical Social Work

## 2013-11-28 NOTE — Progress Notes (Signed)
Pt. Complained of chest tightness, states too many people telling him what to do. Denies complaints of chest pain, dizziness, and no shortness of breath noted. BP 175/79 HR= 91. Then decreased to 153/73 HR=86 Resp. 20 O2 sat 95% room air. Instructed pt. To call for any other changes. Continue to assess and monitor.

## 2013-11-28 NOTE — Progress Notes (Signed)
Pt. Left via stretcher with [facility]. No respiratory distress noted, pt. Denies complaints.

## 2013-11-29 ENCOUNTER — Inpatient Hospital Stay (HOSPITAL_COMMUNITY)
Admission: EM | Admit: 2013-11-29 | Discharge: 2013-12-05 | DRG: 350 | Disposition: A | Discharge status: Home / Self Care | Payer: Medicare Other | Attending: Internal Medicine | Admitting: Internal Medicine

## 2013-11-29 ENCOUNTER — Emergency Department (HOSPITAL_COMMUNITY): Payer: Medicare Other

## 2013-11-29 DIAGNOSIS — E785 Hyperlipidemia, unspecified: Secondary | ICD-10-CM | POA: Diagnosis present

## 2013-11-29 DIAGNOSIS — R739 Hyperglycemia, unspecified: Secondary | ICD-10-CM | POA: Diagnosis present

## 2013-11-29 DIAGNOSIS — J9600 Acute respiratory failure, unspecified whether with hypoxia or hypercapnia: Secondary | ICD-10-CM | POA: Diagnosis not present

## 2013-11-29 DIAGNOSIS — K219 Gastro-esophageal reflux disease without esophagitis: Secondary | ICD-10-CM | POA: Diagnosis present

## 2013-11-29 DIAGNOSIS — Z79899 Other long term (current) drug therapy: Secondary | ICD-10-CM

## 2013-11-29 DIAGNOSIS — Z96649 Presence of unspecified artificial hip joint: Secondary | ICD-10-CM

## 2013-11-29 DIAGNOSIS — D62 Acute posthemorrhagic anemia: Secondary | ICD-10-CM | POA: Diagnosis present

## 2013-11-29 DIAGNOSIS — R1110 Vomiting, unspecified: Secondary | ICD-10-CM

## 2013-11-29 DIAGNOSIS — K4030 Unilateral inguinal hernia, with obstruction, without gangrene, not specified as recurrent: Secondary | ICD-10-CM | POA: Diagnosis present

## 2013-11-29 DIAGNOSIS — Z791 Long term (current) use of non-steroidal anti-inflammatories (NSAID): Secondary | ICD-10-CM

## 2013-11-29 DIAGNOSIS — Z9861 Coronary angioplasty status: Secondary | ICD-10-CM

## 2013-11-29 DIAGNOSIS — B952 Enterococcus as the cause of diseases classified elsewhere: Secondary | ICD-10-CM

## 2013-11-29 DIAGNOSIS — J189 Pneumonia, unspecified organism: Secondary | ICD-10-CM | POA: Diagnosis present

## 2013-11-29 DIAGNOSIS — R7309 Other abnormal glucose: Secondary | ICD-10-CM | POA: Diagnosis not present

## 2013-11-29 DIAGNOSIS — K929 Disease of digestive system, unspecified: Secondary | ICD-10-CM | POA: Diagnosis not present

## 2013-11-29 DIAGNOSIS — K228 Other specified diseases of esophagus: Secondary | ICD-10-CM | POA: Diagnosis present

## 2013-11-29 DIAGNOSIS — N5089 Other specified disorders of the male genital organs: Secondary | ICD-10-CM | POA: Diagnosis not present

## 2013-11-29 DIAGNOSIS — K4090 Unilateral inguinal hernia, without obstruction or gangrene, not specified as recurrent: Secondary | ICD-10-CM

## 2013-11-29 DIAGNOSIS — K560 Paralytic ileus: Secondary | ICD-10-CM | POA: Diagnosis not present

## 2013-11-29 DIAGNOSIS — R109 Unspecified abdominal pain: Secondary | ICD-10-CM

## 2013-11-29 DIAGNOSIS — A419 Sepsis, unspecified organism: Secondary | ICD-10-CM | POA: Diagnosis present

## 2013-11-29 DIAGNOSIS — I2510 Atherosclerotic heart disease of native coronary artery without angina pectoris: Secondary | ICD-10-CM | POA: Diagnosis present

## 2013-11-29 DIAGNOSIS — A409 Streptococcal sepsis, unspecified: Secondary | ICD-10-CM | POA: Diagnosis present

## 2013-11-29 DIAGNOSIS — K56609 Unspecified intestinal obstruction, unspecified as to partial versus complete obstruction: Secondary | ICD-10-CM | POA: Diagnosis present

## 2013-11-29 DIAGNOSIS — D649 Anemia, unspecified: Secondary | ICD-10-CM | POA: Diagnosis present

## 2013-11-29 DIAGNOSIS — Z9001 Acquired absence of eye: Secondary | ICD-10-CM

## 2013-11-29 DIAGNOSIS — M169 Osteoarthritis of hip, unspecified: Secondary | ICD-10-CM | POA: Diagnosis present

## 2013-11-29 DIAGNOSIS — I4891 Unspecified atrial fibrillation: Secondary | ICD-10-CM | POA: Diagnosis present

## 2013-11-29 DIAGNOSIS — M1610 Unilateral primary osteoarthritis, unspecified hip: Secondary | ICD-10-CM | POA: Diagnosis present

## 2013-11-29 DIAGNOSIS — Z87891 Personal history of nicotine dependence: Secondary | ICD-10-CM

## 2013-11-29 DIAGNOSIS — K2289 Other specified disease of esophagus: Secondary | ICD-10-CM | POA: Diagnosis present

## 2013-11-29 DIAGNOSIS — K4031 Unilateral inguinal hernia, with obstruction, without gangrene, recurrent: Principal | ICD-10-CM | POA: Diagnosis present

## 2013-11-29 DIAGNOSIS — N400 Enlarged prostate without lower urinary tract symptoms: Secondary | ICD-10-CM | POA: Diagnosis present

## 2013-11-29 DIAGNOSIS — I10 Essential (primary) hypertension: Secondary | ICD-10-CM | POA: Diagnosis present

## 2013-11-29 DIAGNOSIS — I959 Hypotension, unspecified: Secondary | ICD-10-CM | POA: Diagnosis not present

## 2013-11-29 HISTORY — DX: Unilateral inguinal hernia, with obstruction, without gangrene, not specified as recurrent: K40.30

## 2013-11-29 LAB — TYPE AND SCREEN
ABO/RH(D): B POS
Unit division: 0

## 2013-11-29 MED ORDER — ONDANSETRON HCL 4 MG/2ML IJ SOLN
4.0000 mg | Freq: Once | INTRAMUSCULAR | Status: AC
  Administered 2013-11-29: 4 mg via INTRAVENOUS
  Filled 2013-11-29: qty 2

## 2013-11-29 MED ORDER — FENTANYL CITRATE 0.05 MG/ML IJ SOLN
25.0000 ug | Freq: Once | INTRAMUSCULAR | Status: AC
  Administered 2013-11-29: 25 ug via INTRAVENOUS
  Filled 2013-11-29: qty 2

## 2013-11-29 MED ORDER — FLEET ENEMA 7-19 GM/118ML RE ENEM
1.0000 | ENEMA | Freq: Once | RECTAL | Status: AC
  Administered 2013-11-30: 1 via RECTAL
  Filled 2013-11-29: qty 1

## 2013-11-29 MED ORDER — POLYETHYLENE GLYCOL 3350 17 G PO PACK
17.0000 g | PACK | Freq: Every day | ORAL | Status: DC
  Administered 2013-11-30: 17 g via ORAL
  Filled 2013-11-29 (×2): qty 1

## 2013-11-29 MED ORDER — ONDANSETRON HCL 4 MG/2ML IJ SOLN
4.0000 mg | Freq: Once | INTRAMUSCULAR | Status: AC
  Administered 2013-11-30: 4 mg via INTRAVENOUS
  Filled 2013-11-29: qty 2

## 2013-11-29 MED ORDER — SODIUM CHLORIDE 0.9 % IV BOLUS (SEPSIS)
500.0000 mL | Freq: Once | INTRAVENOUS | Status: AC
  Administered 2013-11-30: 500 mL via INTRAVENOUS

## 2013-11-29 NOTE — ED Notes (Addendum)
Pt presents to ED, adominal pain. Pt was discharged from hospital on last evening to rehab/ SNF. He denies n/v prior admission and stated that he tolerated his diet well. He c/o pain 10/10. Abdomen.

## 2013-11-29 NOTE — ED Notes (Signed)
Bed: AO13 Expected date: 11/29/13 Expected time:  Means of arrival:  Comments: EMS constipation, last BM on Tues

## 2013-11-29 NOTE — ED Notes (Signed)
EM[facility]- [facility] staff advised pt had left hip surgery on Wednesday 12/17. Has not had a BM since. Released to their facility on 11/28/13 staff  At [facility] gave him enema miralax and ducolax- with no results. NH staff gave miralax, dulcolax, and mag citrate with no results. Ate very little  On 12/20 and  No food today due to discomfort. 10/10 pain .  Bil lower abd quad pain with  tenderness. Very uncomfortable. VSS- 112/60, 80hr, 16 rr.  Patient currently nauseated but EMS did not give Anything to treat it.  NKDA.
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
## 2013-11-29 NOTE — Progress Notes (Signed)
Clinical Social Work Department CLINICAL SOCIAL WORK PLACEMENT NOTE 11/29/2013  Patient:  Barry Morgan, Barry Morgan  Account Number:  [id number] Admit date:  11/25/2013  Clinical Social Worker:  Cori Razor, LCSW  Date/time:  11/26/2013 08:26 AM  Clinical Social Work is seeking post-discharge placement for this patient at the following level of care:   SKILLED NURSING   (*CSW will update this form in Epic as items are completed)     Patient/family provided with [facility] Department of Clinical Social Work's list of facilities offering this level of care within the geographic area requested by the patient (or if unable, by the patient's family).  11/26/2013  Patient/family informed of their freedom to choose among providers that offer the needed level of care, that participate in Medicare, Medicaid or managed care program needed by the patient, have an available bed and are willing to accept the patient.  11/26/2013  Patient/family informed of [facility]' ownership interest in [facility], as well as of the fact that they are under no obligation to receive care at this facility.  PASARR submitted to EDS on 11/25/2013 PASARR number received from EDS on 11/25/2013  FL2 transmitted to all facilities in geographic area requested by pt/family on  11/26/2013 FL2 transmitted to all facilities within larger geographic area on   Patient informed that his/her managed care company has contracts with or will negotiate with  certain facilities, including the following:     Patient/family informed of bed offers received:  11/26/2013 Patient chooses bed at [facility] Physician recommends and patient chooses bed at    Patient to be transferred to [facility] on  11/28/2013 Patient to be transferred to facility by EMS  The following physician request were entered in Epic:   Additional Comments: Pt is aware insurance may not cover ambulance transport to SNF.  Cori Razor LCSW  [phone]

## 2013-11-30 ENCOUNTER — Emergency Department (HOSPITAL_COMMUNITY): Payer: Medicare Other

## 2013-11-30 ENCOUNTER — Encounter (HOSPITAL_COMMUNITY)
Admission: EM | Disposition: A | Discharge status: Home / Self Care | Payer: Self-pay | Source: Home / Self Care | Attending: Internal Medicine

## 2013-11-30 ENCOUNTER — Encounter (HOSPITAL_COMMUNITY): Payer: Self-pay

## 2013-11-30 ENCOUNTER — Inpatient Hospital Stay (HOSPITAL_COMMUNITY): Payer: Medicare Other

## 2013-11-30 ENCOUNTER — Inpatient Hospital Stay (HOSPITAL_COMMUNITY): Payer: Medicare Other | Admitting: Anesthesiology

## 2013-11-30 ENCOUNTER — Encounter (HOSPITAL_COMMUNITY): Payer: Medicare Other | Admitting: Anesthesiology

## 2013-11-30 DIAGNOSIS — E785 Hyperlipidemia, unspecified: Secondary | ICD-10-CM | POA: Diagnosis present

## 2013-11-30 DIAGNOSIS — K4030 Unilateral inguinal hernia, with obstruction, without gangrene, not specified as recurrent: Secondary | ICD-10-CM

## 2013-11-30 DIAGNOSIS — D649 Anemia, unspecified: Secondary | ICD-10-CM | POA: Diagnosis present

## 2013-11-30 DIAGNOSIS — K4091 Unilateral inguinal hernia, without obstruction or gangrene, recurrent: Secondary | ICD-10-CM

## 2013-11-30 DIAGNOSIS — J9600 Acute respiratory failure, unspecified whether with hypoxia or hypercapnia: Secondary | ICD-10-CM | POA: Diagnosis present

## 2013-11-30 DIAGNOSIS — J189 Pneumonia, unspecified organism: Secondary | ICD-10-CM

## 2013-11-30 DIAGNOSIS — K56609 Unspecified intestinal obstruction, unspecified as to partial versus complete obstruction: Secondary | ICD-10-CM

## 2013-11-30 DIAGNOSIS — I2510 Atherosclerotic heart disease of native coronary artery without angina pectoris: Secondary | ICD-10-CM

## 2013-11-30 HISTORY — PX: INGUINAL HERNIA REPAIR: SHX194

## 2013-11-30 HISTORY — PX: INSERTION OF MESH: SHX5868

## 2013-11-30 LAB — COMPREHENSIVE METABOLIC PANEL
BUN: 33 mg/dL — ABNORMAL HIGH (ref 6–23)
CO2: 30 mEq/L (ref 19–32)
Chloride: 94 mEq/L — ABNORMAL LOW (ref 96–112)
Creatinine, Ser: 1.35 mg/dL (ref 0.50–1.35)
GFR calc non Af Amer: 51 mL/min — ABNORMAL LOW (ref >90)
Total Bilirubin: 0.9 mg/dL (ref 0.3–1.2)

## 2013-11-30 LAB — CBC WITH DIFFERENTIAL/PLATELET
Basophils Relative: 0 % (ref 0–1)
Basophils Relative: 0 % (ref 0–1)
Eosinophils Absolute: 0 10*3/uL (ref 0.0–0.7)
Eosinophils Absolute: 0 10*3/uL (ref 0.0–0.7)
Eosinophils Relative: 0 % (ref 0–5)
HCT: 36.4 % — ABNORMAL LOW (ref 39.0–52.0)
Hemoglobin: 11.2 g/dL — ABNORMAL LOW (ref 13.0–17.0)
Hemoglobin: 11.9 g/dL — ABNORMAL LOW (ref 13.0–17.0)
Lymphocytes Relative: 5 % — ABNORMAL LOW (ref 12–46)
Lymphs Abs: 0.7 10*3/uL (ref 0.7–4.0)
Lymphs Abs: 0.3 10*3/uL — ABNORMAL LOW (ref 0.7–4.0)
MCH: 30.9 pg (ref 26.0–34.0)
MCH: 30.1 pg (ref 26.0–34.0)
MCHC: 34 g/dL (ref 30.0–36.0)
MCHC: 32.7 g/dL (ref 30.0–36.0)
MCV: 90.9 fL (ref 78.0–100.0)
Monocytes Absolute: 0.4 10*3/uL (ref 0.1–1.0)
Monocytes Relative: 6 % (ref 3–12)
Monocytes Relative: 7 % (ref 3–12)
Neutro Abs: 5 10*3/uL (ref 1.7–7.7)
Neutrophils Relative %: 87 % — ABNORMAL HIGH (ref 43–77)
RBC: 3.62 MIL/uL — ABNORMAL LOW (ref 4.22–5.81)
WBC: 9.9 10*3/uL (ref 4.0–10.5)
WBC: 5.7 10*3/uL (ref 4.0–10.5)

## 2013-11-30 LAB — BASIC METABOLIC PANEL
BUN: 26 mg/dL — ABNORMAL HIGH (ref 6–23)
Calcium: 9.2 mg/dL (ref 8.4–10.5)
Creatinine, Ser: 1 mg/dL (ref 0.50–1.35)
GFR calc Af Amer: 85 mL/min — ABNORMAL LOW (ref >90)
GFR calc non Af Amer: 73 mL/min — ABNORMAL LOW (ref >90)
Glucose, Bld: 188 mg/dL — ABNORMAL HIGH (ref 70–99)
Potassium: 3.5 mEq/L (ref 3.5–5.1)

## 2013-11-30 LAB — GLUCOSE, CAPILLARY
Glucose-Capillary: 146 mg/dL — ABNORMAL HIGH (ref 70–99)
Glucose-Capillary: 133 mg/dL — ABNORMAL HIGH (ref 70–99)
Glucose-Capillary: 136 mg/dL — ABNORMAL HIGH (ref 70–99)
Glucose-Capillary: 124 mg/dL — ABNORMAL HIGH (ref 70–99)

## 2013-11-30 SURGERY — REPAIR, HERNIA, INGUINAL, INCARCERATED
Anesthesia: General | Laterality: Left

## 2013-11-30 MED ORDER — SODIUM CHLORIDE 0.9 % IJ SOLN
INTRAMUSCULAR | Status: AC
  Filled 2013-11-30: qty 20

## 2013-11-30 MED ORDER — PHENYLEPHRINE HCL 10 MG/ML IJ SOLN
INTRAMUSCULAR | Status: DC | PRN
  Administered 2013-11-30: 80 ug via INTRAVENOUS
  Administered 2013-11-30: 120 ug via INTRAVENOUS

## 2013-11-30 MED ORDER — ONDANSETRON HCL 4 MG/2ML IJ SOLN: 4.0000 mg | Freq: Four times a day (QID) | INTRAMUSCULAR | Status: DC | PRN

## 2013-11-30 MED ORDER — 0.9 % SODIUM CHLORIDE (POUR BTL) OPTIME
TOPICAL | Status: DC | PRN
  Administered 2013-11-30: 1000 mL

## 2013-11-30 MED ORDER — NEOSTIGMINE METHYLSULFATE 1 MG/ML IJ SOLN
INTRAMUSCULAR | Status: DC | PRN
  Administered 2013-11-30: 4 mg via INTRAVENOUS

## 2013-11-30 MED ORDER — SODIUM CHLORIDE 0.9 % IV SOLN
INTRAVENOUS | Status: DC
  Administered 2013-11-30: 07:00:00 via INTRAVENOUS

## 2013-11-30 MED ORDER — ONDANSETRON HCL 4 MG/2ML IJ SOLN
INTRAMUSCULAR | Status: DC | PRN
  Administered 2013-11-30: 4 mg via INTRAVENOUS

## 2013-11-30 MED ORDER — VANCOMYCIN HCL 10 G IV SOLR
1500.0000 mg | Freq: Once | INTRAVENOUS | Status: AC
  Administered 2013-11-30: 1500 mg via INTRAVENOUS
  Filled 2013-11-30: qty 1500

## 2013-11-30 MED ORDER — ONDANSETRON HCL 4 MG PO TABS: 4.0000 mg | ORAL_TABLET | Freq: Four times a day (QID) | ORAL | Status: DC | PRN

## 2013-11-30 MED ORDER — SODIUM CHLORIDE 0.9 % IJ SOLN
INTRAMUSCULAR | Status: DC | PRN
  Administered 2013-11-30: 10 mL via INTRAVENOUS

## 2013-11-30 MED ORDER — HYDROMORPHONE HCL PF 1 MG/ML IJ SOLN
0.2500 mg | INTRAMUSCULAR | Status: DC | PRN
  Administered 2013-11-30: 0.5 mg via INTRAVENOUS
  Filled 2013-11-30: qty 1

## 2013-11-30 MED ORDER — LEVALBUTEROL HCL 0.63 MG/3ML IN NEBU
0.6300 mg | INHALATION_SOLUTION | Freq: Four times a day (QID) | RESPIRATORY_TRACT | Status: DC
  Administered 2013-12-01 – 2013-12-02 (×5): 0.63 mg via RESPIRATORY_TRACT
  Filled 2013-11-30 (×14): qty 3

## 2013-11-30 MED ORDER — HYDROMORPHONE HCL PF 1 MG/ML IJ SOLN
1.0000 mg | INTRAMUSCULAR | Status: DC | PRN
  Administered 2013-11-30: 1 mg via INTRAVENOUS
  Filled 2013-11-30: qty 1

## 2013-11-30 MED ORDER — BUPIVACAINE LIPOSOME 1.3 % IJ SUSP
20.0000 mL | Freq: Once | INTRAMUSCULAR | Status: AC
  Administered 2013-11-30: 20 mL
  Filled 2013-11-30: qty 20

## 2013-11-30 MED ORDER — LIDOCAINE HCL (CARDIAC) 20 MG/ML IV SOLN
INTRAVENOUS | Status: AC
  Filled 2013-11-30: qty 5

## 2013-11-30 MED ORDER — CISATRACURIUM BESYLATE (PF) 10 MG/5ML IV SOLN
INTRAVENOUS | Status: DC | PRN
  Administered 2013-11-30: 4 mg via INTRAVENOUS
  Administered 2013-11-30: 12 mg via INTRAVENOUS

## 2013-11-30 MED ORDER — GLYCOPYRROLATE 0.2 MG/ML IJ SOLN
INTRAMUSCULAR | Status: DC | PRN
  Administered 2013-11-30: .6 mg via INTRAVENOUS

## 2013-11-30 MED ORDER — LEVALBUTEROL HCL 0.63 MG/3ML IN NEBU
0.6300 mg | INHALATION_SOLUTION | Freq: Four times a day (QID) | RESPIRATORY_TRACT | Status: DC
  Administered 2013-11-30: 0.63 mg via RESPIRATORY_TRACT
  Filled 2013-11-30 (×2): qty 3

## 2013-11-30 MED ORDER — ACETAMINOPHEN 650 MG RE SUPP: 650.0000 mg | Freq: Four times a day (QID) | RECTAL | Status: DC | PRN

## 2013-11-30 MED ORDER — LIDOCAINE HCL (CARDIAC) 20 MG/ML IV SOLN
INTRAVENOUS | Status: DC | PRN
  Administered 2013-11-30: 100 mg via INTRAVENOUS

## 2013-11-30 MED ORDER — SODIUM CHLORIDE 0.9 % IJ SOLN
INTRAMUSCULAR | Status: AC
  Filled 2013-11-30: qty 10

## 2013-11-30 MED ORDER — BUPIVACAINE HCL (PF) 0.25 % IJ SOLN
INTRAMUSCULAR | Status: DC | PRN
  Administered 2013-11-30: 30 mL

## 2013-11-30 MED ORDER — LACTATED RINGERS IV SOLN
INTRAVENOUS | Status: DC | PRN
  Administered 2013-11-30 (×2): via INTRAVENOUS

## 2013-11-30 MED ORDER — PIPERACILLIN-TAZOBACTAM 3.375 G IVPB 30 MIN
3.3750 g | Freq: Once | INTRAVENOUS | Status: AC
  Administered 2013-11-30: 3.375 g via INTRAVENOUS
  Filled 2013-11-30: qty 50

## 2013-11-30 MED ORDER — PHENYLEPHRINE 40 MCG/ML (10ML) SYRINGE FOR IV PUSH (FOR BLOOD PRESSURE SUPPORT)
PREFILLED_SYRINGE | INTRAVENOUS | Status: AC
  Filled 2013-11-30: qty 10

## 2013-11-30 MED ORDER — SODIUM CHLORIDE 0.9 % IV SOLN
10.0000 mg | INTRAVENOUS | Status: DC | PRN
  Administered 2013-11-30: 100 ug/min via INTRAVENOUS

## 2013-11-30 MED ORDER — VANCOMYCIN HCL 10 G IV SOLR
1250.0000 mg | Freq: Two times a day (BID) | INTRAVENOUS | Status: DC
  Administered 2013-11-30 – 2013-12-05 (×10): 1250 mg via INTRAVENOUS
  Filled 2013-11-30 (×13): qty 1250

## 2013-11-30 MED ORDER — IOHEXOL 350 MG/ML SOLN
100.0000 mL | Freq: Once | INTRAVENOUS | Status: AC | PRN
  Administered 2013-11-30: 100 mL via INTRAVENOUS

## 2013-11-30 MED ORDER — SUFENTANIL CITRATE 50 MCG/ML IV SOLN
INTRAVENOUS | Status: AC
  Filled 2013-11-30: qty 1

## 2013-11-30 MED ORDER — HYDROMORPHONE HCL PF 1 MG/ML IJ SOLN
0.5000 mg | Freq: Once | INTRAMUSCULAR | Status: AC
  Administered 2013-11-30: 0.5 mg via INTRAVENOUS
  Filled 2013-11-30: qty 1

## 2013-11-30 MED ORDER — PROPOFOL 10 MG/ML IV BOLUS
INTRAVENOUS | Status: AC
  Filled 2013-11-30: qty 20

## 2013-11-30 MED ORDER — ONDANSETRON HCL 4 MG/2ML IJ SOLN
INTRAMUSCULAR | Status: AC
  Filled 2013-11-30: qty 2

## 2013-11-30 MED ORDER — ONDANSETRON HCL 4 MG/2ML IJ SOLN: 4.0000 mg | Freq: Three times a day (TID) | INTRAMUSCULAR | Status: DC | PRN

## 2013-11-30 MED ORDER — PIPERACILLIN-TAZOBACTAM 3.375 G IVPB
3.3750 g | Freq: Three times a day (TID) | INTRAVENOUS | Status: DC
  Administered 2013-11-30 – 2013-12-04 (×11): 3.375 g via INTRAVENOUS
  Filled 2013-11-30 (×16): qty 50

## 2013-11-30 MED ORDER — ACETAMINOPHEN 325 MG PO TABS
650.0000 mg | ORAL_TABLET | Freq: Four times a day (QID) | ORAL | Status: DC | PRN
  Administered 2013-12-01 – 2013-12-05 (×3): 650 mg via ORAL
  Filled 2013-11-30 (×4): qty 2

## 2013-11-30 MED ORDER — PROMETHAZINE HCL 25 MG/ML IJ SOLN: 6.2500 mg | INTRAMUSCULAR | Status: DC | PRN

## 2013-11-30 MED ORDER — LABETALOL HCL 5 MG/ML IV SOLN
5.0000 mg | INTRAVENOUS | Status: DC | PRN
  Filled 2013-11-30: qty 4

## 2013-11-30 MED ORDER — SODIUM CHLORIDE 0.9 % IV SOLN
INTRAVENOUS | Status: AC
  Administered 2013-11-30 (×2): via INTRAVENOUS

## 2013-11-30 MED ORDER — LACTATED RINGERS IV SOLN: INTRAVENOUS | Status: DC

## 2013-11-30 MED ORDER — SUCCINYLCHOLINE CHLORIDE 20 MG/ML IJ SOLN
INTRAMUSCULAR | Status: AC
  Filled 2013-11-30: qty 1

## 2013-11-30 MED ORDER — PHENYLEPHRINE HCL 10 MG/ML IJ SOLN
INTRAMUSCULAR | Status: AC
  Filled 2013-11-30: qty 1

## 2013-11-30 MED ORDER — SUCCINYLCHOLINE CHLORIDE 20 MG/ML IJ SOLN
INTRAMUSCULAR | Status: DC | PRN
  Administered 2013-11-30: 100 mg via INTRAVENOUS

## 2013-11-30 MED ORDER — SUFENTANIL CITRATE 50 MCG/ML IV SOLN
INTRAVENOUS | Status: DC | PRN
  Administered 2013-11-30: 20 ug via INTRAVENOUS
  Administered 2013-11-30 (×2): 5 ug via INTRAVENOUS

## 2013-11-30 MED ORDER — NEOSTIGMINE METHYLSULFATE 1 MG/ML IJ SOLN
INTRAMUSCULAR | Status: AC
  Filled 2013-11-30: qty 10

## 2013-11-30 MED ORDER — PANTOPRAZOLE SODIUM 40 MG IV SOLR
40.0000 mg | INTRAVENOUS | Status: DC
  Administered 2013-11-30 – 2013-12-05 (×5): 40 mg via INTRAVENOUS
  Filled 2013-11-30 (×6): qty 40

## 2013-11-30 MED ORDER — PROPOFOL 10 MG/ML IV BOLUS
INTRAVENOUS | Status: DC | PRN
  Administered 2013-11-30: 170 mg via INTRAVENOUS

## 2013-11-30 MED ORDER — GLYCOPYRROLATE 0.2 MG/ML IJ SOLN
INTRAMUSCULAR | Status: AC
  Filled 2013-11-30: qty 3

## 2013-11-30 MED ORDER — CISATRACURIUM BESYLATE 20 MG/10ML IV SOLN
INTRAVENOUS | Status: AC
  Filled 2013-11-30: qty 10

## 2013-11-30 MED ORDER — DOCUSATE SODIUM 100 MG PO CAPS
100.0000 mg | ORAL_CAPSULE | Freq: Once | ORAL | Status: AC
  Administered 2013-11-30: 100 mg via ORAL
  Filled 2013-11-30: qty 1

## 2013-11-30 MED ORDER — SODIUM CHLORIDE 0.9 % IJ SOLN
3.0000 mL | Freq: Two times a day (BID) | INTRAMUSCULAR | Status: DC
  Administered 2013-11-30 – 2013-12-04 (×7): 3 mL via INTRAVENOUS

## 2013-11-30 MED ORDER — HEPARIN SODIUM (PORCINE) 5000 UNIT/ML IJ SOLN
5000.0000 [IU] | Freq: Three times a day (TID) | INTRAMUSCULAR | Status: DC
  Administered 2013-11-30 – 2013-12-05 (×14): 5000 [IU] via SUBCUTANEOUS
  Filled 2013-11-30 (×17): qty 1

## 2013-11-30 MED ORDER — HYDROMORPHONE HCL PF 1 MG/ML IJ SOLN: 0.5000 mg | INTRAMUSCULAR | Status: DC | PRN

## 2013-11-30 MED ORDER — BUPIVACAINE HCL (PF) 0.25 % IJ SOLN
INTRAMUSCULAR | Status: AC
  Filled 2013-11-30: qty 30

## 2013-11-30 SURGICAL SUPPLY — 41 items
BENZOIN TINCTURE PRP APPL 2/3 (GAUZE/BANDAGES/DRESSINGS) IMPLANT
BLADE SURG SZ10 CARB STEEL (BLADE) ×2 IMPLANT
DECANTER SPIKE VIAL GLASS SM (MISCELLANEOUS) IMPLANT
DERMABOND ADVANCED (GAUZE/BANDAGES/DRESSINGS) ×1
DERMABOND ADVANCED .7 DNX12 (GAUZE/BANDAGES/DRESSINGS) ×1 IMPLANT
DISSECT BALLN SPACEMKR + OVL (BALLOONS)
DISSECTOR BALLN SPACEMKR + OVL (BALLOONS) IMPLANT
DISSECTOR BLUNT TIP ENDO 5MM (MISCELLANEOUS) IMPLANT
DRAIN PENROSE 18X1/2 LTX STRL (DRAIN) ×2 IMPLANT
DRAPE LAPAROSCOPIC ABDOMINAL (DRAPES) ×2 IMPLANT
DRAPE UTILITY XL STRL (DRAPES) ×2 IMPLANT
ELECT REM PT RETURN 9FT ADLT (ELECTROSURGICAL) ×2
ELECTRODE REM PT RTRN 9FT ADLT (ELECTROSURGICAL) ×1 IMPLANT
GLOVE BIOGEL PI IND STRL 7.0 (GLOVE) IMPLANT
GLOVE BIOGEL PI INDICATOR 7.0 (GLOVE)
GLOVE SURG SIGNA 7.5 PF LTX (GLOVE) ×2 IMPLANT
GOWN PREVENTION PLUS LG XLONG (DISPOSABLE) ×2 IMPLANT
GOWN STRL REIN XL XLG (GOWN DISPOSABLE) ×4 IMPLANT
KIT BASIN OR (CUSTOM PROCEDURE TRAY) ×2 IMPLANT
MESH ULTRAPRO 3X6 7.6X15CM (Mesh General) ×2 IMPLANT
NEEDLE INSUFFLATION 14GA 120MM (NEEDLE) IMPLANT
SET IRRIG TUBING LAPAROSCOPIC (IRRIGATION / IRRIGATOR) IMPLANT
SOLUTION ANTI FOG 6CC (MISCELLANEOUS) IMPLANT
SPONGE LAP 18X18 X RAY DECT (DISPOSABLE) ×4 IMPLANT
STRIP CLOSURE SKIN 1/2X4 (GAUZE/BANDAGES/DRESSINGS) IMPLANT
SUT MON AB 5-0 PS2 18 (SUTURE) IMPLANT
SUT NOVA 0 T19/GS 22DT (SUTURE) ×6 IMPLANT
SUT VIC AB 2-0 SH 27 (SUTURE) ×1
SUT VIC AB 2-0 SH 27X BRD (SUTURE) ×1 IMPLANT
SUT VIC AB 3-0 SH 18 (SUTURE) ×2 IMPLANT
SUT VIC AB 3-0 SH 27 (SUTURE) ×1
SUT VIC AB 3-0 SH 27X BRD (SUTURE) ×1 IMPLANT
SUT VIC AB 5-0 PS2 18 (SUTURE) IMPLANT
SYR BULB IRRIGATION 50ML (SYRINGE) ×2 IMPLANT
TACKER 5MM HERNIA 3.5CML NAB (ENDOMECHANICALS) IMPLANT
TOWEL OR 17X26 10 PK STRL BLUE (TOWEL DISPOSABLE) ×2 IMPLANT
TRAY FOLEY CATH 14FRSI W/METER (CATHETERS) ×2 IMPLANT
TRAY LAP CHOLE (CUSTOM PROCEDURE TRAY) ×2 IMPLANT
TROCAR BLADELESS OPT 5 75 (ENDOMECHANICALS) ×4 IMPLANT
TUBING INSUFFLATION 10FT LAP (TUBING) ×2 IMPLANT
YANKAUER SUCT BULB TIP NO VENT (SUCTIONS) ×2 IMPLANT

## 2013-11-30 NOTE — Anesthesia Procedure Notes (Signed)
Procedure Name: Intubation Date/Time: 11/30/2013 11:05 AM Performed by: Leroy Libman L Patient Re-evaluated:Patient Re-evaluated prior to inductionOxygen Delivery Method: Circle system utilized Preoxygenation: Pre-oxygenation with 100% oxygen Intubation Type: IV induction, Cricoid Pressure applied and Rapid sequence Laryngoscope Size: Miller and 3 Grade View: Grade I Tube type: Subglottic suction tube Tube size: 8.0 mm Number of attempts: 1 Airway Equipment and Method: Stylet Placement Confirmation: ETT inserted through vocal cords under direct vision,  breath sounds checked- equal and bilateral and positive ETCO2 Secured at: 22 cm Tube secured with: Tape Dental Injury: Teeth and Oropharynx as per pre-operative assessment

## 2013-11-30 NOTE — ED Notes (Signed)
Patient transported to CT 

## 2013-11-30 NOTE — Transfer of Care (Signed)
Immediate Anesthesia Transfer of Care Note  Patient: Barry Morgan  Procedure(s) Performed: Procedure(s): HERNIA REPAIR INGUINAL INCARCERATED (Left) INSERTION OF MESH (Left)  Patient Location: PACU  Anesthesia Type:General  Level of Consciousness: awake, alert  and oriented  Airway & Oxygen Therapy: Patient Spontanous Breathing and non-rebreather face mask  Post-op Assessment: Report given to PACU RN and Post -op Vital signs reviewed and stable  Post vital signs: Reviewed and stable  Complications: No apparent anesthesia complications

## 2013-11-30 NOTE — Progress Notes (Signed)
Pt now in 1222 - ICU  Has incarcerated left inguinal hernia with colon in the hernia.  Had left hip surgery by Dr. Merlyn Albert - 11/25/2013. He was in [facility] for rehab. By CT scan also has bilateral pneumonia and incarcerated left inguinal hernia.  I discussed the indications and complications of hernia surgery with the patient.  I discussed both the laparoscopic and open approach to hernia repair..  The potential risks of hernia surgery include, but are not limited to, bleeding, infection, open surgery, nerve injury, and recurrence of the hernia.  I provided the patient literature about hernia surgery. If the colon is damaged, he could end up with a colon resection and colostomy. There is a chance he will be on a ventilator post op.  Ovidio Kin, MD, [facility] Surgery Pager: [phone] Office phone:  [phone]

## 2013-11-30 NOTE — Progress Notes (Signed)
ANTIBIOTIC CONSULT NOTE - INITIAL  Pharmacy Consult for Vancomycin and Zosyn Indication: pneumonia  No Known Allergies  Patient Measurements: Height: 5' 10.8" (179.8 cm) Weight: 218 lb 14.7 oz (99.3 kg) IBW/kg (Calculated) : 74.84  Vital Signs: Temp: 97.7 F (36.5 C) (12/21 2206) Temp src: Oral (12/21 2206) BP: 162/60 mmHg (12/22 0746) Pulse Rate: 82 (12/22 0658) Intake/Output from previous day:   Intake/Output from this shift:    Labs:  Recent Labs  11/28/13 0559 11/29/13 2325  WBC 7.1 9.9  HGB 7.3* 11.2*  PLT 159 227  CREATININE  --  1.00   Estimated Creatinine Clearance: 79.9 ml/min (by C-G formula based on Cr of 1). 67 ml/min/1.56m2 (normalized)  No results found for this basename: VANCOTROUGH, VANCOPEAK, VANCORANDOM, GENTTROUGH, GENTPEAK, GENTRANDOM, TOBRATROUGH, TOBRAPEAK, TOBRARND, AMIKACINPEAK, AMIKACINTROU, AMIKACIN,  in the last 72 hours   Microbiology: Recent Results (from the past 720 hour(s))  SURGICAL PCR SCREEN     Status: None   Collection Time    11/19/13 10:28 AM      Result Value Range Status   MRSA, PCR NEGATIVE  NEGATIVE Final   Staphylococcus aureus NEGATIVE  NEGATIVE Final   Comment:            The Xpert SA Assay (FDA     approved for NASAL specimens     in patients over 58 years of age),     is one component of     a comprehensive surveillance     program.  Test performance has     been validated by [employer] for patients greater     than or equal to 48 year old.     It is not intended     to diagnose infection nor to     guide or monitor treatment.   12/22 blood x2: pending  Medical History: Past Medical History  Diagnosis Date  . Hyperlipidemia   . Eye globe prosthesis     RIGHT  . GERD (gastroesophageal reflux disease)   . Hypertension   . Dysrhythmia   . BPH (benign prostatic hypertrophy)   . PONV (postoperative nausea and vomiting)     SICK FOR 3 DAYS AFTER COLONOSCOPY  . Coronary artery disease     DRUG  ELUTING STENT DISTAL RCA 04/2012  . Shortness of breath     ONLY IF CLIMBING 2 OR 3 FLIGHTS OF STAIRS  . Arthritis     PAIN AND OA LEFT HIP    Medications:  Scheduled:  . sodium chloride  3 mL Intravenous Q12H  . vancomycin  1,500 mg Intravenous Once   Infusions:  . sodium chloride     Assessment: 72 yo male s/p hip ORIF 12/17, admitted from [facility] with abdominal pain, N/V. CT shows incarcerated left inguinal hernia and complete obstruction of sigmoid colon. Planning emergent surgery. CT also reveals bilateral PNA, therefore, beginning broad spectrum antibiotics with Vancomycin and Zosyn.  Goal of Therapy:  Vancomycin trough level 15-20 mcg/ml Zosyn dose per renal function  Plan:   Received Vancomycin 1500mg  IV x 1 in ER, will continue with 1250mg  IV q12h Check trough at steady state Received Zosyn 3.375g IV x 1 in ER, continue with Zosyn 3.375gm IV q8h (4hr extended infusions) Follow up renal function & cultures  Loralee Pacas, PharmD, BCPS Pager: [phone] 11/30/2013,8:27 AM

## 2013-11-30 NOTE — Op Note (Signed)
11/29/2013 - 11/30/2013  1:27 PM  PATIENT:  Barry Morgan, 72 y.o., male, MRN: [id number]  PREOP DIAGNOSIS:  incarcerated left inguinal hernia, history of prior left inguinal hernia repair x 2  POSTOP DIAGNOSIS:   Incarcerated direct left inguinal hernia (sigmoid colon in hernia on CT scan), history of prior left inguinal hernia repair x 2  PROCEDURE:   Procedure(s): REPAIR of Recurrent INCARCERATED Left HERNIA  INGUINAL, INSERTION OF MESH  SURGEON:   Ovidio Kin, M.D.  ANESTHESIA:   general  Anesthesiologist: Einar Pheasant, MD CRNA: Florene Route, CRNA; Orest Dikes, CRNA  General  EBL:  minimal  ml  LOCAL MEDICATIONS USED:   20 cc Exparel  SPECIMEN:   None  COUNTS CORRECT:  YES  INDICATIONS FOR PROCEDURE:  Barry Morgan is a 72 y.o. (DOB: [birth year]) white  male whose primary care physician is Vernona Rieger, MD.  He was admitted with bilateral pneumonia and an incarcerated left inguinal hernia.  He had had the left inguinal hernia repaired twice before in [place].   The indications and risks of the hernia surgery were explained to the patient.  The risks include, but are not limited to, infection, bleeding, recurrence of the hernia, and nerve injury.  Operative Note: The patient was taken to room number 6 at [facility] OR.  He underwent a general anesthesia.  He was hypotensive early in the operation.  A time out was held and the surgical checklist run.  His lower abdomen was shaved and then prepped with chloroprep.  I was able to reduce the hernia once he was asleep and relaxed.  A left inguinal incision was made through the subcutaneous fat to the external oblique fascia.  He had a lot of scarring from his prior inguinal hernia operations.  The external ring was opened and the cord structures encircled with a penrose drain.  The patient had a recurrent direct inguinal hernia.    The external ring was opened and the cord structures encircled with a penrose drain.  I  elevated the external oblique fascia outside the prior dissection.  The patient had an direct right inguinal hernia.  It looked like the patient did not have medial coverage of the inguinal floor with mesh - either the mesh pulled away or the area was not covered.  There was no hernia sac to deal with - I reduced it when I reduced the hernia.   I oversewed the medial inguinal floor with 2-0 Vicryl to keep the preperitoneal fat from protruding out of the hernia defect.  The inguinal floor was repaired with a 3 x 6 inch piece of Ultrapro mesh.  The mesh was cut to fit the inguinal floor.  The mesh was sewn in place with interrupted 0 Novafil suture.  A key hole was made for the internal ring.  The mesh lay flat and covered the medial inguinal floor defect.  The inguinal floor was covered and the internal ring recreated.  The cord structures were returned to a normal location.  The external oblique was closed with a 3-0 vicryl.  The fascia and subcutaneous tissues were infiltrated with 20 cc of Exparel.  The skin was closed with 5-0 monocryl and painted with Dermabond. The sponge and needle count were correct at the end of the case.  The patient came from the ICU pre op.  At the time of this dictation, it is unclear whether the patient will remain intubated.   Ovidio Kin, MD, [facility] Surgery Pager:  [phone] Office phone:  [phone]
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
## 2013-11-30 NOTE — Preoperative (Signed)
Beta Blockers   Reason not to administer Beta Blockers:Not Applicable 

## 2013-11-30 NOTE — Progress Notes (Signed)
Patient is a pleasant 72 year old gentleman with a past medical history of coronary artery disease, who presented to the emergency department overnight with complaints of abdominal pain nausea and vomiting with associated progressive abdominal distention. CT scan showed distal obstruction at the level of the proximal sigmoid colon due to too small to moderate left inguinal hernia containing a segment of inflamed proximal sigmoid colon. There was also diffuse distention of the colon with fluid noted. Also CT scan showed multifocal pneumonia prominent at the lower lung lobes particularly the left. General surgery and pulmonary critical care were consulted. He was taken to the OR today where he underwent repair of incarcerated left hernia and insertion of mesh. He tolerated the procedure well there are no immediate complications. NG tube is currently in place. He is currently on Zosyn 3.375 g IV and vancomycin IV.  On exam patient appears comfortable, no acute distress. On lung exam he had a few bibasilar crackles, coarse respiratory sounds, though he does not appear to be in acute respiratory distress, able to speak full sentences.

## 2013-11-30 NOTE — Anesthesia Preprocedure Evaluation (Addendum)
Anesthesia Evaluation  Patient identified by MRN, date of birth, ID band Patient awake    Reviewed: Allergy & Precautions, H&P , NPO status , Patient's Chart, lab work & pertinent test results  Airway Mallampati: II TM Distance: >3 FB Neck ROM: Full    Dental  (+) Teeth Intact and Dental Advisory Given   Pulmonary shortness of breath, pneumonia -, former smoker,  breath sounds clear to auscultation  Pulmonary exam normal       Cardiovascular hypertension, Pt. on medications + CAD and + Cardiac Stents + dysrhythmias Atrial Fibrillation Rhythm:Regular Rate:Normal     Neuro/Psych negative neurological ROS  negative psych ROS   GI/Hepatic Neg liver ROS, GERD-  Medicated,  Endo/Other  negative endocrine ROS  Renal/GU negative Renal ROS  negative genitourinary   Musculoskeletal negative musculoskeletal ROS (+)   Abdominal   Peds  Hematology negative hematology ROS (+) anemia ,   Anesthesia Other Findings Prosthetic right eye  Reproductive/Obstetrics                        Anesthesia Physical Anesthesia Plan  ASA: III and emergent  Anesthesia Plan: General   Post-op Pain Management:    Induction: Intravenous, Rapid sequence and Cricoid pressure planned  Airway Management Planned: Oral ETT  Additional Equipment:   Intra-op Plan:   Post-operative Plan: Possible Post-op intubation/ventilation  Informed Consent: I have reviewed the patients History and Physical, chart, labs and discussed the procedure including the risks, benefits and alternatives for the proposed anesthesia with the patient or authorized representative who has indicated his/her understanding and acceptance.   Dental advisory given  Plan Discussed with: CRNA  Anesthesia Plan Comments:        Anesthesia Quick Evaluation
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
## 2013-11-30 NOTE — Anesthesia Postprocedure Evaluation (Signed)
Anesthesia Post Note  Patient: Barry Morgan  Procedure(s) Performed: Procedure(s) (LRB): HERNIA REPAIR INGUINAL INCARCERATED (Left) INSERTION OF MESH (Left)  Anesthesia type: General  Patient location: PACU  Post pain: Pain level controlled  Post assessment: Post-op Vital signs reviewed  Last Vitals:  Filed Vitals:   11/30/13 1345  BP: 167/73  Pulse:   Temp: 36.9 C  Resp: 18    Post vital signs: Reviewed  Level of consciousness: sedated  Complications: No apparent anesthesia complications
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
## 2013-11-30 NOTE — ED Notes (Addendum)
RN, Eustace Pen made aware of pt. Oxygen level decreased to 80%. Pt. placed On 3 liters of Oxygen.

## 2013-12-01 ENCOUNTER — Inpatient Hospital Stay (HOSPITAL_COMMUNITY): Payer: Medicare Other

## 2013-12-01 ENCOUNTER — Encounter (HOSPITAL_COMMUNITY): Payer: Self-pay | Admitting: Surgery

## 2013-12-01 DIAGNOSIS — R1110 Vomiting, unspecified: Secondary | ICD-10-CM

## 2013-12-01 DIAGNOSIS — A419 Sepsis, unspecified organism: Secondary | ICD-10-CM

## 2013-12-01 DIAGNOSIS — K4090 Unilateral inguinal hernia, without obstruction or gangrene, not specified as recurrent: Secondary | ICD-10-CM

## 2013-12-01 LAB — CBC
HCT: 29.8 % — ABNORMAL LOW (ref 39.0–52.0)
Platelets: 213 10*3/uL (ref 150–400)
RDW: 15.5 % (ref 11.5–15.5)
WBC: 6.3 10*3/uL (ref 4.0–10.5)

## 2013-12-01 LAB — BASIC METABOLIC PANEL
Calcium: 7.8 mg/dL — ABNORMAL LOW (ref 8.4–10.5)
Chloride: 102 mEq/L (ref 96–112)
Creatinine, Ser: 1.28 mg/dL (ref 0.50–1.35)
GFR calc Af Amer: 63 mL/min — ABNORMAL LOW (ref >90)

## 2013-12-01 LAB — GLUCOSE, CAPILLARY
Glucose-Capillary: 105 mg/dL — ABNORMAL HIGH (ref 70–99)
Glucose-Capillary: 117 mg/dL — ABNORMAL HIGH (ref 70–99)

## 2013-12-01 LAB — BLOOD GAS, ARTERIAL
Acid-Base Excess: 8.2 mmol/L — ABNORMAL HIGH (ref 0.0–2.0)
Drawn by: 232811
O2 Content: 5 L/min
O2 Saturation: 95.6 %
pCO2 arterial: 45.3 mmHg — ABNORMAL HIGH (ref 35.0–45.0)

## 2013-12-01 LAB — PHOSPHORUS: Phosphorus: 5 mg/dL — ABNORMAL HIGH (ref 2.3–4.6)

## 2013-12-01 LAB — LACTIC ACID, PLASMA: Lactic Acid, Venous: 2 mmol/L (ref 0.5–2.2)

## 2013-12-01 MED ORDER — FLEET ENEMA 7-19 GM/118ML RE ENEM
1.0000 | ENEMA | Freq: Once | RECTAL | Status: AC
  Administered 2013-12-01: 1 via RECTAL
  Filled 2013-12-01: qty 1

## 2013-12-01 MED ORDER — FENTANYL CITRATE 0.05 MG/ML IJ SOLN: 50.0000 ug | INTRAMUSCULAR | Status: DC | PRN

## 2013-12-01 MED ORDER — KCL IN DEXTROSE-NACL 20-5-0.45 MEQ/L-%-% IV SOLN
INTRAVENOUS | Status: DC
  Administered 2013-12-01 – 2013-12-04 (×3): via INTRAVENOUS
  Filled 2013-12-01 (×9): qty 1000

## 2013-12-01 MED ORDER — HYDROCODONE-ACETAMINOPHEN 5-325 MG PO TABS
1.0000 | ORAL_TABLET | ORAL | Status: DC | PRN
  Administered 2013-12-01 (×2): 1 via ORAL
  Filled 2013-12-01 (×2): qty 1

## 2013-12-01 NOTE — Progress Notes (Signed)
Flutter gave to pt and pt understands how to use.

## 2013-12-01 NOTE — Progress Notes (Signed)
Agree with A&P of KO,PA. He wants to downgrade to oral pain meds

## 2013-12-01 NOTE — Progress Notes (Signed)
Patient is asking for liquids to drink and a laxative.  Phoned Dr. Jamey Ripa re:  Patient's request., change in diet order denied, give enema if patient so desires.  This was explained to patient that diet is restricted to ice chips and sips of water with medications and that enema could be administered if he desired.

## 2013-12-01 NOTE — Progress Notes (Signed)
Name: Barry Morgan MRN: [id number] DOB: January 15, 1941    ADMISSION DATE:  11/29/2013 CONSULTATION DATE:  12-22  REFERRING MD :  [facility] PRIMARY SERVICE: [facility]  CHIEF COMPLAINT:  Abd. Pain, hypoxia  BRIEF PATIENT DESCRIPTION:  [name] who had left  ant hip replacement 12-17 and presents to [facility] with abdominal pain and found to incarcerated hernia with bowel obstruction and CT of chest with bilateral pneumonia . He has a PMH significant for CAD and OA. PCCM asked to evaluate and due to hid FIO2 needs, pneumonia he will most likely need post operative ventilator support.  SIGNIFICANT EVENTS / STUDIES:  12-22 bowel obstruction 12-22 REPAIR of Recurrent INCARCERATED Left HERNIA INGUINAL, INSERTION OF MESH  LINES / TUBES:   CULTURES: 12-22 bc x 2>>gpc/pairs/chains>>  ANTIBIOTICS: 12-22 vanc>> 12-22 pip-tazo>>  HISTORY OF PRESENT ILLNESS:   [name] who had left  ant hip replacement 12-17 and presents to [facility] with abdominal pain and found to incarcerated hernia with bowel obstruction and CT of chest with bilateral pneumonia . He has a PMH significant for CAD and OA. PCCM asked to evaluate and due to his FIO2 needs, pneumonia he will most likely need post operative ventilator support.  SUBJECTIVE:  Awake and alert, did not require vent post op. VITAL SIGNS: Temp:  [98.2 F (36.8 C)-98.4 F (36.9 C)] 98.2 F (36.8 C) (12/23 0400) Pulse Rate:  [71-84] 74 (12/23 0200) Resp:  [14-21] 16 (12/23 0200) BP: (134-171)/(52-93) 149/60 mmHg (12/23 0400) SpO2:  [91 %-100 %] 98 % (12/23 0728) FiO2 (%):  [100 %] 100 % (12/22 1000) Weight:  [232 lb 12.9 oz (105.6 kg)] 232 lb 12.9 oz (105.6 kg) (12/22 0815) HEMODYNAMICS:   VENTILATOR SETTINGS: Vent Mode:  [-]  FiO2 (%):  [100 %] 100 % INTAKE / OUTPUT: Intake/Output     12/22 0701 - 12/23 0700 12/23 0701 - 12/24 0700   I.V. (mL/kg) 3453 (32.7)    IV Piggyback 350    Total Intake(mL/kg) 3803 (36)    Urine (mL/kg/hr) 2485 (1)    Blood 75  (0)    Total Output 2560     Net +1243            PHYSICAL EXAMINATION: General:  WNWDWM Neuro:  Intact, awake and follows commands HEENT:  No JVD/LAN. Rt eye prosthesis Cardiovascular:  HSR RRR Lungs:  CTA but diminished in bases, faint crackles Abdomen:  Tender, dressing left inguinal area intact Musculoskeletal:  RT hip wound unremakable Skin:  warm  LABS:  CBC  Recent Labs Lab 11/29/13 2325 11/30/13 1012 12/01/13 0340  WBC 9.9 5.7 6.3  HGB 11.2* 11.9* 9.7*  HCT 32.9* 36.4* 29.8*  PLT 227 214 213   Coag's  Recent Labs Lab 11/30/13 0906  INR 0.97   BMET  Recent Labs Lab 11/29/13 2325 11/30/13 0906 12/01/13 0340  NA 135 137 142  K 3.5 4.3 4.5  CL 93* 94* 102  CO2 32 30 35*  BUN 26* 33* 39*  CREATININE 1.00 1.35 1.28  GLUCOSE 188* 180* 122*   Electrolytes  Recent Labs Lab 11/29/13 2325 11/30/13 0906 12/01/13 0340  CALCIUM 9.2 8.8 7.8*  MG  --   --  4.6*  PHOS  --   --  5.0*   Sepsis Markers  Recent Labs Lab 11/30/13 0444  LATICACIDVEN 3.3*   ABG  Recent Labs Lab 12/01/13 0525  PHART 7.470*  PCO2ART 45.3*  PO2ART 77.5*   Liver Enzymes  Recent  Labs Lab 11/30/13 0906  AST 43*  ALT 37  ALKPHOS 61  BILITOT 0.9  ALBUMIN 2.9*   Cardiac Enzymes No results found for this basename: TROPONINI, PROBNP,  in the last 168 hours Glucose  Recent Labs Lab 11/30/13 1006 11/30/13 1400 11/30/13 1617 11/30/13 2015 11/30/13 2349 12/01/13 0345  GLUCAP 146* 133* 136* 124* 119* 105*    Imaging Ct Angio Chest Pe W/cm &/or Wo Cm  11/30/2013   CLINICAL DATA:  Chest pain, constipation and decreased O2 saturation. History of smoking.  EXAM: CT ANGIOGRAPHY CHEST  CT ABDOMEN AND PELVIS WITH CONTRAST  TECHNIQUE: Multidetector CT imaging of the chest was performed using the standard protocol during bolus administration of intravenous contrast. Multiplanar CT image reconstructions including MIPs were obtained to evaluate the vascular anatomy.  Multidetector CT imaging of the abdomen and pelvis was performed using the standard protocol during bolus administration of intravenous contrast.  CONTRAST:  OMNIPAQUE IOHEXOL 350 MG/ML SOLN  COMPARISON:  Chest and abdominal radiographs performed 11/29/2013  FINDINGS: CTA CHEST FINDINGS  There is no evidence of pulmonary embolus. There is no evidence of aortic dissection. The thoracic aorta is unremarkable in appearance. The proximal great vessels are within normal limits.  Fluffy bilateral airspace opacification is noted in the lower lung lobes and to a lesser extent within the posterior right upper lobe. Airspace opacification is most dense at the left lower lobe. Findings are compatible with multifocal pneumonia. There is no evidence of pleural effusion or pneumothorax. No masses are identified; no abnormal focal contrast enhancement is seen.  The mediastinum is unremarkable in appearance, aside diffuse coronary artery calcification. No mediastinal lymphadenopathy is seen. No pericardial effusion is identified.  Incidental note is made of diffuse distention of the esophagus, with dilatation of the distal esophagus with fluid. This may reflect esophageal dysmotility, with an associated small to moderate hiatal hernia. No axillary lymphadenopathy is seen. The thyroid gland is unremarkable in appearance.  No acute osseous abnormalities are seen. Healed right-sided rib fractures are noted. Mild degenerative change is noted at the lower cervical spine.  CT ABDOMEN and PELVIS FINDINGS  There is no evidence of aortic dissection. Scattered calcific atherosclerotic disease is noted along the abdominal aorta, without evidence of aneurysmal dilatation.  Scattered hypodensities are noted within the liver, measuring up to 2.5 cm in size. These are nonspecific, but may reflect cysts. The spleen is unremarkable in appearance. The gallbladder is within normal limits. The pancreas and adrenal glands are unremarkable.   Nonspecific perinephric stranding is noted bilaterally. The kidneys are otherwise unremarkable in appearance. Contrast is noted filling the renal calyces, limiting evaluation for renal stones. No obstructing ureteral stones are seen. There is no evidence of hydronephrosis.  The colon is diffusely distended with fluid, with the cecum measuring up to 11.1 cm. There is gradual fecalization of the distal descending colon. This appears to reflect distal obstruction due to a small to moderate left inguinal hernia, containing a segment of mildly inflamed proximal sigmoid colon. Trace associated free fluid is seen. The small bowel is unremarkable in appearance. The stomach is within normal limits. No acute vascular abnormalities are seen.  The appendix is unremarkable in appearance; there is no evidence for appendicitis.  The bladder is mildly distended and grossly unremarkable the prostate remains normal in size, with scattered calcification. Postoperative calcifications are seen about the left hip, with associated left hip prosthesis. The prosthesis is incompletely imaged on this study. No inguinal lymphadenopathy is seen.  No  acute osseous abnormalities are identified. Vacuum phenomenon is noted at L4-5.  Review of the MIP images confirms the above findings.  IMPRESSION: 1. Distal obstruction at the level of the proximal sigmoid colon, due to a small to moderate left inguinal hernia, containing a segment of mildly inflamed proximal sigmoid colon. Diffuse distention of the colon with fluid, measuring up to 11.1 cm at the cecum, with gradual fecalization of the distal descending colon. Manual reduction may be possible, though difficult. 2. Multifocal pneumonia noted, most prominent at the lower lung lobes, particularly on the left. 3. No evidence of pulmonary embolus. No evidence of aortic dissection. 4. Diffuse distention of the esophagus, filled with fluid, with an associated small to moderate hiatal hernia. This raises  concern for chronic esophageal dysmotility. 5. Scattered calcific atherosclerotic disease noted along the abdominal aorta. 6. Scattered likely hepatic cysts seen.  These results were called by telephone at the time of interpretation on 11/30/2013 at 4:18 AM to Dr. Blane Ohara, who verbally acknowledged these results.   Electronically Signed   By: Roanna Raider M.D.   On: 11/30/2013 04:32   Ct Abdomen Pelvis W Contrast  11/30/2013   CLINICAL DATA:  Chest pain, constipation and decreased O2 saturation. History of smoking.  EXAM: CT ANGIOGRAPHY CHEST  CT ABDOMEN AND PELVIS WITH CONTRAST  TECHNIQUE: Multidetector CT imaging of the chest was performed using the standard protocol during bolus administration of intravenous contrast. Multiplanar CT image reconstructions including MIPs were obtained to evaluate the vascular anatomy. Multidetector CT imaging of the abdomen and pelvis was performed using the standard protocol during bolus administration of intravenous contrast.  CONTRAST:  OMNIPAQUE IOHEXOL 350 MG/ML SOLN  COMPARISON:  Chest and abdominal radiographs performed 11/29/2013  FINDINGS: CTA CHEST FINDINGS  There is no evidence of pulmonary embolus. There is no evidence of aortic dissection. The thoracic aorta is unremarkable in appearance. The proximal great vessels are within normal limits.  Fluffy bilateral airspace opacification is noted in the lower lung lobes and to a lesser extent within the posterior right upper lobe. Airspace opacification is most dense at the left lower lobe. Findings are compatible with multifocal pneumonia. There is no evidence of pleural effusion or pneumothorax. No masses are identified; no abnormal focal contrast enhancement is seen.  The mediastinum is unremarkable in appearance, aside diffuse coronary artery calcification. No mediastinal lymphadenopathy is seen. No pericardial effusion is identified.  Incidental note is made of diffuse distention of the esophagus, with  dilatation of the distal esophagus with fluid. This may reflect esophageal dysmotility, with an associated small to moderate hiatal hernia. No axillary lymphadenopathy is seen. The thyroid gland is unremarkable in appearance.  No acute osseous abnormalities are seen. Healed right-sided rib fractures are noted. Mild degenerative change is noted at the lower cervical spine.  CT ABDOMEN and PELVIS FINDINGS  There is no evidence of aortic dissection. Scattered calcific atherosclerotic disease is noted along the abdominal aorta, without evidence of aneurysmal dilatation.  Scattered hypodensities are noted within the liver, measuring up to 2.5 cm in size. These are nonspecific, but may reflect cysts. The spleen is unremarkable in appearance. The gallbladder is within normal limits. The pancreas and adrenal glands are unremarkable.  Nonspecific perinephric stranding is noted bilaterally. The kidneys are otherwise unremarkable in appearance. Contrast is noted filling the renal calyces, limiting evaluation for renal stones. No obstructing ureteral stones are seen. There is no evidence of hydronephrosis.  The colon is diffusely distended with fluid, with the  cecum measuring up to 11.1 cm. There is gradual fecalization of the distal descending colon. This appears to reflect distal obstruction due to a small to moderate left inguinal hernia, containing a segment of mildly inflamed proximal sigmoid colon. Trace associated free fluid is seen. The small bowel is unremarkable in appearance. The stomach is within normal limits. No acute vascular abnormalities are seen.  The appendix is unremarkable in appearance; there is no evidence for appendicitis.  The bladder is mildly distended and grossly unremarkable the prostate remains normal in size, with scattered calcification. Postoperative calcifications are seen about the left hip, with associated left hip prosthesis. The prosthesis is incompletely imaged on this study. No inguinal  lymphadenopathy is seen.  No acute osseous abnormalities are identified. Vacuum phenomenon is noted at L4-5.  Review of the MIP images confirms the above findings.  IMPRESSION: 1. Distal obstruction at the level of the proximal sigmoid colon, due to a small to moderate left inguinal hernia, containing a segment of mildly inflamed proximal sigmoid colon. Diffuse distention of the colon with fluid, measuring up to 11.1 cm at the cecum, with gradual fecalization of the distal descending colon. Manual reduction may be possible, though difficult. 2. Multifocal pneumonia noted, most prominent at the lower lung lobes, particularly on the left. 3. No evidence of pulmonary embolus. No evidence of aortic dissection. 4. Diffuse distention of the esophagus, filled with fluid, with an associated small to moderate hiatal hernia. This raises concern for chronic esophageal dysmotility. 5. Scattered calcific atherosclerotic disease noted along the abdominal aorta. 6. Scattered likely hepatic cysts seen.  These results were called by telephone at the time of interpretation on 11/30/2013 at 4:18 AM to Dr. Blane Ohara, who verbally acknowledged these results.   Electronically Signed   By: Roanna Raider M.D.   On: 11/30/2013 04:32   Dg Chest Port 1 View  12/01/2013   CLINICAL DATA:  Pneumonia.  EXAM: PORTABLE CHEST - 1 VIEW  COMPARISON:  Chest CT 11/30/2013.  Chest x-ray 11/19/2013.  FINDINGS: NG tube present with tip below the left hemidiaphragm. Mediastinum and hilar structures normal. Patchy bilateral pulmonary infiltrates are noted particularly in the left lower lobe. Left lower lobe infiltrate is prominent. No evidence of pneumothorax. Heart size and pulmonary vascularity normal. No acute osseous abnormality.  IMPRESSION: 1. Bilateral pulmonary infiltrates particularly in the left lower lobe. Left lower lobe infiltrate is prominent. Findings consistent with pneumonia. 2. NG tube noted with tip below left hemidiaphragm.    Electronically Signed   By: Maisie Fus  Register   On: 12/01/2013 07:09   Dg Abd Acute W/chest  11/29/2013   CLINICAL DATA:  Constipation and vomiting. Recent left total hip arthroplasty.  EXAM: ACUTE ABDOMEN SERIES (ABDOMEN 2 VIEW & CHEST 1 VIEW)  COMPARISON:  Pelvis radiograph performed 11/25/2013  FINDINGS: The lungs are relatively well-aerated and clear. There is no evidence of focal opacification, pleural effusion or pneumothorax. The cardiomediastinal silhouette is within normal limits.  The visualized bowel gas pattern is nonspecific. Scattered fluid and air are seen within the small bowel, with a few small air-fluid levels, and scattered stool is noted within the colon; there is no evidence of small bowel dilatation to suggest obstruction. No free intra-abdominal air is identified on the provided decubitus view.  There is suggestion of increased lucency about the socket of the patient's left hip arthroplasty. If the patient has associated significant symptoms, dedicated left hip radiographs could be considered for further evaluation.  A few tiny stones  are noted at the interpole region of the left kidney.  IMPRESSION: 1. Nonspecific bowel gas pattern; no evidence of bowel dilatation to suggest obstruction, though a few small air-fluid levels are seen. No free intra-abdominal air identified. 2. No acute cardiopulmonary process seen. 3. Suggestion of increased lucency about the site of the patient's left hip arthroplasty. If the patient has significant associated left hip symptoms, dedicated left hip radiographs could be considered for further evaluation. 4. Tiny left renal stones incidentally noted.   Electronically Signed   By: Roanna Raider M.D.   On: 11/29/2013 23:50   Dg Abd Portable 1v  11/30/2013   CLINICAL DATA:  Nasogastric tube placement  EXAM: PORTABLE ABDOMEN - 1 VIEW  COMPARISON:  See CT abdomen and pelvis November 30, 2013  FINDINGS: Nasogastric tube tip and side port are in the proximal  stomach ; the side port is just below the gastroesophageal junction. Most bowel loops are fluid-filled as is noted on CT obtained earlier in the day. No free air is seen on this examination. There is atelectasis in the left lung base.  IMPRESSION: Nasogastric tube tip and side port in proximal stomach. Most bowel loops appear fluid-filled.   Electronically Signed   By: Bretta Bang M.D.   On: 11/30/2013 06:58   ASSESSMENT / PLAN:  PULMONARY A:Hypoxia, pnuemonia P:   - Titrate O2 as needed. - BD's as ordered. -Moblize as tolerated  CARDIOVASCULAR A: Hx of CAD P:  - Cardiac monitoring in ICU - Check 12 lead for baseline ->NAD  RENAL Lab Results  Component Value Date   CREATININE 1.28 12/01/2013   CREATININE 1.35 11/30/2013   CREATININE 1.00 11/29/2013    Recent Labs Lab 11/29/13 2325 11/30/13 0906 12/01/13 0340  K 3.5 4.3 4.5     A: No acute process P:   - Recheck BMET and replace electrolytes as indicated.  GASTROINTESTINAL A:  Bowel obstruction, incarcerated herina  P:   - PPI. - Per [facility]. - NPO, will start TF when ok with [facility].  HEMATOLOGIC A:  No acute issue P:  - Daily CBC, transfusion as per protocol.  INFECTIOUS A:  Presumed pneumonia        Bacteriemia gpc  P:   - See flow sheet.  Will maintain abx for now. -recheck lactic acid 12-23  ENDOCRINE A: No Acute process P:   - Monitor for now.  NEUROLOGIC A: No acute process P:   TODAY'S SUMMARY:   [name] who had left  ant hip replacement 12-17 and presents to [facility] with abdominal pain and found to incarcerated hernia with bowel obstruction and CT of chest with bilateral pneumonia . He has a PMH significant for CAD and OA. PCCM asked to evaluate and due to his FIO2 needs. 12-23 not on ventilator. CxR better. GPC in blood. Transfer to floor per [facility]. PCCM will sign off and [facility] can resume care.  Brett Canales Minor ACNP [facility] PCCM Pager [phone] till 3 pm If no answer page [phone] 12/01/2013,  8:14 AM

## 2013-12-01 NOTE — Progress Notes (Addendum)
[facility] PROGRESS NOTE  Wheeler Incorvaia ZOX:[id number] DOB: 09/20/1941 DOA: 11/29/2013 PCP: Vernona Rieger, MD  Interim Summary Patient is a pleasant 72 year old gentleman with a past medical history of hypertension, dyslipidemia, presented to the emergency department on 11/30/2013 with complaints of abdominal pain associated with nausea,vomiting, and abdominal distention. He was also found to be in acute hypoxemic respiratory failure in emergent apartment requiring supplemental oxygen. A CT scan of abdomen and pelvis showed a distal obstruction at the level of the proximal sigmoid colon due to a small to moderate left inguinal hernia containing segment of inflamed proximal sigmoid colon. There was diffuse distention of the colon with fluid measuring up to 11.1 cm. A CT scan of lungs showed multifocal pneumonia prominent at the left lower lobe. He was started on broad-spectrum IV antibiotic therapy with vancomycin and Zosyn, admitted to the step and unit. General surgery and pulmonary critical care medicine were consulted. He was taken to the OR on 11/30/2013 where he underwent repair of incarcerated left inguinal hernia with insertion of mesh. He tolerated procedure well, extubated without any issues with NG tube left in place. Patient was transferred back to the step down unit in stable condition. Given clinical improvement on 12/01/2013 he was transferred out of the step down unit to telemetry. given the overall improvement with minimal NG tube output, his NG tube was discontinued on 12/01/2013. He remains n.p.o. and is on IV vancomycin and Zosyn. Plan to repeat a.m. chest x-ray to followup on pneumonia. Patient had blood cultures drawn on 11/30/2013 which are now growing gram-positive cocci in pairs and chains.                                                                                                                                                                           Assessment/Plan: 1. Incarcerated left hernia, patient taken to the OR on 11/28/2013 undergoing repair of recurrent incarcerated left inguinal hernia, with insertion of mesh. His NG tube was discontinued today however will be kept N.p.o. until bowel function returns. General surgery following. 2. Community acquire pneumonia versus aspiration pneumonia. Prior to presentation, patient reported multiple episodes of nausea and vomiting and feels that he may have choked. A CT scan of lungs performed on admission showed multifocal pneumonia. Although he seems to be improving from a respiratory standpoint, blood cultures obtained on 11/30/2013 are now growing gram-positive cocci in pairs and chains. Continue current antibiotic regimen. Repeat blood cultures as well as CXR.   3. Sepsis, evidence by positive blood cultures, acute respiratory failure, could be secondary to underlying pneumonia. Followup on organism identification and susceptibility testing, continue current antibiotic regimen. 4. Acute hypoxemic respiratory failure evidence by an O2 sat of 80% on admission. Likely  secondary to aspiration pneumonia versus community-acquired pneumonia. From a respiratory standpoint he is stable will continue require 4 L supplemental oxygen via nasal cannula. Continue broad-spectrum IV antibiotic therapy 5. Paroxysmal atrial fibrillation. Heart rates remained controlled.  6. Diet. N.p.o. 7. DVT prophylaxis. Subcutaneous heparin  Code Status: Full Code Disposition Plan: Transferring to telemetry   Consultants:  General surgery  Pulmonary critical care medicine  Procedures:  Repair of incarcerated inguinal hernia performed on 11/30/2013  Antibiotics:  Vancomycin (started on 11/30/2013)  Zosyn (started on 11/30/2013)  HPI/Subjective: Patient states doing better today, nontoxic appearing, NG tube in place currently n.p.o.  Objective: Filed Vitals:   12/01/13 1410  BP: 157/77  Pulse: 76  Temp:  98.1 F (36.7 C)  Resp: 18    Intake/Output Summary (Last 24 hours) at 12/01/13 1835 Last data filed at 12/01/13 1624  Gross per 24 hour  Intake 2440.5 ml  Output   1925 ml  Net  515.5 ml   Filed Weights   11/30/13 0437 11/30/13 0815  Weight: 99.3 kg (218 lb 14.7 oz) 105.6 kg (232 lb 12.9 oz)    Exam:   General:  Nontoxic, awake alert oriented times  Cardiovascular: Regular rate rhythm normal S1 is  Respiratory: Scattered rhonchi, crackles, on supplemental oxygen via nasal cannula  Abdomen: Status post hernia repair  Musculoskeletal: No edema   Data Reviewed: Basic Metabolic Panel:  Recent Labs Lab 11/26/13 0505 11/27/13 0542 11/29/13 2325 11/30/13 0906 12/01/13 0340  NA 131* 134* 135 137 142  K 4.6 4.7 3.5 4.3 4.5  CL 100 101 93* 94* 102  CO2 24 25 32 30 35*  GLUCOSE 135* 131* 188* 180* 122*  BUN 24* 26* 26* 33* 39*  CREATININE 1.06 1.10 1.00 1.35 1.28  CALCIUM 7.9* 8.1* 9.2 8.8 7.8*  MG  --   --   --   --  4.6*  PHOS  --   --   --   --  5.0*   Liver Function Tests:  Recent Labs Lab 11/30/13 0906  AST 43*  ALT 37  ALKPHOS 61  BILITOT 0.9  PROT 6.2  ALBUMIN 2.9*   No results found for this basename: LIPASE, AMYLASE,  in the last 168 hours No results found for this basename: AMMONIA,  in the last 168 hours CBC:  Recent Labs Lab 11/27/13 0542 11/28/13 0559 11/29/13 2325 11/30/13 1012 12/01/13 0340  WBC 7.5 7.1 9.9 5.7 6.3  NEUTROABS  --   --  8.6* 5.0  --   HGB 8.2* 7.3* 11.2* 11.9* 9.7*  HCT 24.6* 22.2* 32.9* 36.4* 29.8*  MCV 91.8 92.9 90.9 91.9 93.1  PLT 139* 159 227 214 213   Cardiac Enzymes: No results found for this basename: CKTOTAL, CKMB, CKMBINDEX, TROPONINI,  in the last 168 hours BNP (last 3 results) No results found for this basename: PROBNP,  in the last 8760 hours CBG:  Recent Labs Lab 11/30/13 1617 11/30/13 2015 11/30/13 2349 12/01/13 0345 12/01/13 0927  GLUCAP 136* 124* 119* 105* 117*    Recent Results (from  the past 240 hour(s))  CULTURE, BLOOD (ROUTINE X 2)     Status: None   Collection Time    11/30/13  4:53 AM      Result Value Range Status   Specimen Description BLOOD RIGHT HAND   Final   Special Requests BOTTLES DRAWN AEROBIC ONLY 3CC   Final   Culture  Setup Time     Final   Value: 11/30/2013 09:21  Performed at [facility]     Final   Value: GRAM POSITIVE COCCI IN PAIRS AND CHAINS     Note: Gram Stain Report Called to,Read Back By and Verified With: Max Sane ON 12/01/2013 AT 12:27A BY WILEJ     Performed at [facility]   Report Status PENDING   Incomplete  CULTURE, BLOOD (ROUTINE X 2)     Status: None   Collection Time    11/30/13  5:13 AM      Result Value Range Status   Specimen Description BLOOD LEFT HAND   Final   Special Requests BOTTLES DRAWN AEROBIC AND ANAEROBIC 3CC   Final   Culture  Setup Time     Final   Value: 11/30/2013 09:20     Performed at [facility]   Culture     Final   Value: GRAM POSITIVE COCCI IN PAIRS AND CHAINS     Note: Gram Stain Report Called to,Read Back By and Verified With: Max Sane ON 12/01/2013 AT 12:27A BY WILEJ     Performed at [facility]   Report Status PENDING   Incomplete     Studies: Ct Angio Chest Pe W/cm &/or Wo Cm  11/30/2013   CLINICAL DATA:  Chest pain, constipation and decreased O2 saturation. History of smoking.  EXAM: CT ANGIOGRAPHY CHEST  CT ABDOMEN AND PELVIS WITH CONTRAST  TECHNIQUE: Multidetector CT imaging of the chest was performed using the standard protocol during bolus administration of intravenous contrast. Multiplanar CT image reconstructions including MIPs were obtained to evaluate the vascular anatomy. Multidetector CT imaging of the abdomen and pelvis was performed using the standard protocol during bolus administration of intravenous contrast.  CONTRAST:  OMNIPAQUE IOHEXOL 350 MG/ML SOLN  COMPARISON:  Chest and abdominal radiographs performed  11/29/2013  FINDINGS: CTA CHEST FINDINGS  There is no evidence of pulmonary embolus. There is no evidence of aortic dissection. The thoracic aorta is unremarkable in appearance. The proximal great vessels are within normal limits.  Fluffy bilateral airspace opacification is noted in the lower lung lobes and to a lesser extent within the posterior right upper lobe. Airspace opacification is most dense at the left lower lobe. Findings are compatible with multifocal pneumonia. There is no evidence of pleural effusion or pneumothorax. No masses are identified; no abnormal focal contrast enhancement is seen.  The mediastinum is unremarkable in appearance, aside diffuse coronary artery calcification. No mediastinal lymphadenopathy is seen. No pericardial effusion is identified.  Incidental note is made of diffuse distention of the esophagus, with dilatation of the distal esophagus with fluid. This may reflect esophageal dysmotility, with an associated small to moderate hiatal hernia. No axillary lymphadenopathy is seen. The thyroid gland is unremarkable in appearance.  No acute osseous abnormalities are seen. Healed right-sided rib fractures are noted. Mild degenerative change is noted at the lower cervical spine.  CT ABDOMEN and PELVIS FINDINGS  There is no evidence of aortic dissection. Scattered calcific atherosclerotic disease is noted along the abdominal aorta, without evidence of aneurysmal dilatation.  Scattered hypodensities are noted within the liver, measuring up to 2.5 cm in size. These are nonspecific, but may reflect cysts. The spleen is unremarkable in appearance. The gallbladder is within normal limits. The pancreas and adrenal glands are unremarkable.  Nonspecific perinephric stranding is noted bilaterally. The kidneys are otherwise unremarkable in appearance. Contrast is noted filling the renal calyces, limiting evaluation for renal stones. No obstructing ureteral stones are seen.  There is no evidence of  hydronephrosis.  The colon is diffusely distended with fluid, with the cecum measuring up to 11.1 cm. There is gradual fecalization of the distal descending colon. This appears to reflect distal obstruction due to a small to moderate left inguinal hernia, containing a segment of mildly inflamed proximal sigmoid colon. Trace associated free fluid is seen. The small bowel is unremarkable in appearance. The stomach is within normal limits. No acute vascular abnormalities are seen.  The appendix is unremarkable in appearance; there is no evidence for appendicitis.  The bladder is mildly distended and grossly unremarkable the prostate remains normal in size, with scattered calcification. Postoperative calcifications are seen about the left hip, with associated left hip prosthesis. The prosthesis is incompletely imaged on this study. No inguinal lymphadenopathy is seen.  No acute osseous abnormalities are identified. Vacuum phenomenon is noted at L4-5.  Review of the MIP images confirms the above findings.  IMPRESSION: 1. Distal obstruction at the level of the proximal sigmoid colon, due to a small to moderate left inguinal hernia, containing a segment of mildly inflamed proximal sigmoid colon. Diffuse distention of the colon with fluid, measuring up to 11.1 cm at the cecum, with gradual fecalization of the distal descending colon. Manual reduction may be possible, though difficult. 2. Multifocal pneumonia noted, most prominent at the lower lung lobes, particularly on the left. 3. No evidence of pulmonary embolus. No evidence of aortic dissection. 4. Diffuse distention of the esophagus, filled with fluid, with an associated small to moderate hiatal hernia. This raises concern for chronic esophageal dysmotility. 5. Scattered calcific atherosclerotic disease noted along the abdominal aorta. 6. Scattered likely hepatic cysts seen.  These results were called by telephone at the time of interpretation on 11/30/2013 at 4:18 AM  to Dr. Blane Ohara, who verbally acknowledged these results.   Electronically Signed   By: Roanna Raider M.D.   On: 11/30/2013 04:32   Ct Abdomen Pelvis W Contrast  11/30/2013   CLINICAL DATA:  Chest pain, constipation and decreased O2 saturation. History of smoking.  EXAM: CT ANGIOGRAPHY CHEST  CT ABDOMEN AND PELVIS WITH CONTRAST  TECHNIQUE: Multidetector CT imaging of the chest was performed using the standard protocol during bolus administration of intravenous contrast. Multiplanar CT image reconstructions including MIPs were obtained to evaluate the vascular anatomy. Multidetector CT imaging of the abdomen and pelvis was performed using the standard protocol during bolus administration of intravenous contrast.  CONTRAST:  OMNIPAQUE IOHEXOL 350 MG/ML SOLN  COMPARISON:  Chest and abdominal radiographs performed 11/29/2013  FINDINGS: CTA CHEST FINDINGS  There is no evidence of pulmonary embolus. There is no evidence of aortic dissection. The thoracic aorta is unremarkable in appearance. The proximal great vessels are within normal limits.  Fluffy bilateral airspace opacification is noted in the lower lung lobes and to a lesser extent within the posterior right upper lobe. Airspace opacification is most dense at the left lower lobe. Findings are compatible with multifocal pneumonia. There is no evidence of pleural effusion or pneumothorax. No masses are identified; no abnormal focal contrast enhancement is seen.  The mediastinum is unremarkable in appearance, aside diffuse coronary artery calcification. No mediastinal lymphadenopathy is seen. No pericardial effusion is identified.  Incidental note is made of diffuse distention of the esophagus, with dilatation of the distal esophagus with fluid. This may reflect esophageal dysmotility, with an associated small to moderate hiatal hernia. No axillary lymphadenopathy is seen. The thyroid gland is unremarkable in appearance.  No  acute osseous abnormalities  are seen. Healed right-sided rib fractures are noted. Mild degenerative change is noted at the lower cervical spine.  CT ABDOMEN and PELVIS FINDINGS  There is no evidence of aortic dissection. Scattered calcific atherosclerotic disease is noted along the abdominal aorta, without evidence of aneurysmal dilatation.  Scattered hypodensities are noted within the liver, measuring up to 2.5 cm in size. These are nonspecific, but may reflect cysts. The spleen is unremarkable in appearance. The gallbladder is within normal limits. The pancreas and adrenal glands are unremarkable.  Nonspecific perinephric stranding is noted bilaterally. The kidneys are otherwise unremarkable in appearance. Contrast is noted filling the renal calyces, limiting evaluation for renal stones. No obstructing ureteral stones are seen. There is no evidence of hydronephrosis.  The colon is diffusely distended with fluid, with the cecum measuring up to 11.1 cm. There is gradual fecalization of the distal descending colon. This appears to reflect distal obstruction due to a small to moderate left inguinal hernia, containing a segment of mildly inflamed proximal sigmoid colon. Trace associated free fluid is seen. The small bowel is unremarkable in appearance. The stomach is within normal limits. No acute vascular abnormalities are seen.  The appendix is unremarkable in appearance; there is no evidence for appendicitis.  The bladder is mildly distended and grossly unremarkable the prostate remains normal in size, with scattered calcification. Postoperative calcifications are seen about the left hip, with associated left hip prosthesis. The prosthesis is incompletely imaged on this study. No inguinal lymphadenopathy is seen.  No acute osseous abnormalities are identified. Vacuum phenomenon is noted at L4-5.  Review of the MIP images confirms the above findings.  IMPRESSION: 1. Distal obstruction at the level of the proximal sigmoid colon, due to a small  to moderate left inguinal hernia, containing a segment of mildly inflamed proximal sigmoid colon. Diffuse distention of the colon with fluid, measuring up to 11.1 cm at the cecum, with gradual fecalization of the distal descending colon. Manual reduction may be possible, though difficult. 2. Multifocal pneumonia noted, most prominent at the lower lung lobes, particularly on the left. 3. No evidence of pulmonary embolus. No evidence of aortic dissection. 4. Diffuse distention of the esophagus, filled with fluid, with an associated small to moderate hiatal hernia. This raises concern for chronic esophageal dysmotility. 5. Scattered calcific atherosclerotic disease noted along the abdominal aorta. 6. Scattered likely hepatic cysts seen.  These results were called by telephone at the time of interpretation on 11/30/2013 at 4:18 AM to Dr. Blane Ohara, who verbally acknowledged these results.   Electronically Signed   By: Roanna Raider M.D.   On: 11/30/2013 04:32   Dg Chest Port 1 View  12/01/2013   CLINICAL DATA:  Pneumonia.  EXAM: PORTABLE CHEST - 1 VIEW  COMPARISON:  Chest CT 11/30/2013.  Chest x-ray 11/19/2013.  FINDINGS: NG tube present with tip below the left hemidiaphragm. Mediastinum and hilar structures normal. Patchy bilateral pulmonary infiltrates are noted particularly in the left lower lobe. Left lower lobe infiltrate is prominent. No evidence of pneumothorax. Heart size and pulmonary vascularity normal. No acute osseous abnormality.  IMPRESSION: 1. Bilateral pulmonary infiltrates particularly in the left lower lobe. Left lower lobe infiltrate is prominent. Findings consistent with pneumonia. 2. NG tube noted with tip below left hemidiaphragm.   Electronically Signed   By: Maisie Fus  Register   On: 12/01/2013 07:09   Dg Abd Acute W/chest  11/29/2013   CLINICAL DATA:  Constipation and vomiting. Recent left total hip  arthroplasty.  EXAM: ACUTE ABDOMEN SERIES (ABDOMEN 2 VIEW & CHEST 1 VIEW)  COMPARISON:   Pelvis radiograph performed 11/25/2013  FINDINGS: The lungs are relatively well-aerated and clear. There is no evidence of focal opacification, pleural effusion or pneumothorax. The cardiomediastinal silhouette is within normal limits.  The visualized bowel gas pattern is nonspecific. Scattered fluid and air are seen within the small bowel, with a few small air-fluid levels, and scattered stool is noted within the colon; there is no evidence of small bowel dilatation to suggest obstruction. No free intra-abdominal air is identified on the provided decubitus view.  There is suggestion of increased lucency about the socket of the patient's left hip arthroplasty. If the patient has associated significant symptoms, dedicated left hip radiographs could be considered for further evaluation.  A few tiny stones are noted at the interpole region of the left kidney.  IMPRESSION: 1. Nonspecific bowel gas pattern; no evidence of bowel dilatation to suggest obstruction, though a few small air-fluid levels are seen. No free intra-abdominal air identified. 2. No acute cardiopulmonary process seen. 3. Suggestion of increased lucency about the site of the patient's left hip arthroplasty. If the patient has significant associated left hip symptoms, dedicated left hip radiographs could be considered for further evaluation. 4. Tiny left renal stones incidentally noted.   Electronically Signed   By: Roanna Raider M.D.   On: 11/29/2013 23:50   Dg Abd Portable 1v  11/30/2013   CLINICAL DATA:  Nasogastric tube placement  EXAM: PORTABLE ABDOMEN - 1 VIEW  COMPARISON:  See CT abdomen and pelvis November 30, 2013  FINDINGS: Nasogastric tube tip and side port are in the proximal stomach ; the side port is just below the gastroesophageal junction. Most bowel loops are fluid-filled as is noted on CT obtained earlier in the day. No free air is seen on this examination. There is atelectasis in the left lung base.  IMPRESSION: Nasogastric tube  tip and side port in proximal stomach. Most bowel loops appear fluid-filled.   Electronically Signed   By: Bretta Bang M.D.   On: 11/30/2013 06:58    Scheduled Meds: . heparin subcutaneous  5,000 Units Subcutaneous Q8H  . levalbuterol  0.63 mg Nebulization QID  . pantoprazole (PROTONIX) IV  40 mg Intravenous Q24H  . piperacillin-tazobactam (ZOSYN)  IV  3.375 g Intravenous Q8H  . sodium chloride  3 mL Intravenous Q12H  . vancomycin  1,250 mg Intravenous Q12H   Continuous Infusions: . dextrose 5 % and 0.45 % NaCl with KCl 20 mEq/L 75 mL/hr at 12/01/13 1610    Principal Problem:   Bowel obstruction Active Problems:   Postoperative anemia due to acute blood loss   HCAP (healthcare-associated pneumonia)   CAD (coronary artery disease)   Anemia   HLD (hyperlipidemia)   Acute respiratory failure    Time spent: 35 minutes    Jeralyn Bennett  [facility] Pager [phone]. If 7PM-7AM, please contact night-coverage at [email or web address], password [facility] 12/01/2013, 6:35 PM  LOS: 2 days

## 2013-12-01 NOTE — Progress Notes (Addendum)
Received from ICU via wheelchair, alert and oriented, without complaints of pain at this time.  Left hip dressing dry and intact with back of thigh bruising present, left lower abdominal clear dressing present and bruising observed.  Abdomen  distended, tight, no bowel sounds present, not passing flatus.  Patient coughing up whitish to yellowish thick sputum, oxygen at 4L via nasal cannula.  Oriented patient to room, call light controls, bed controls, diet status is NPO with sips of water with his medications, occasional ice chips and need to call staff for assistance to bathroom.

## 2013-12-01 NOTE — Evaluation (Signed)
Physical Therapy Evaluation Patient Details Name: Barry Morgan MRN: [id number] DOB: 1941-11-09 Today's Date: 12/01/2013 Time: 4098-1191 PT Time Calculation (min): 39 min  PT Assessment / Plan / Recommendation History of Present Illness  SBO, PNA, recent THA  Clinical Impression  *Pt admitted with SBO, PNA, recent THA*. Pt currently with functional limitations due to the deficits listed below (see PT Problem List). ** Pt will benefit from skilled PT to increase their independence and safety with mobility to allow discharge to the venue listed below. *  **    PT Assessment  Patient needs continued PT services    Follow Up Recommendations  SNF    Does the patient have the potential to tolerate intense rehabilitation      Barriers to Discharge Inaccessible home environment 2 flights of stairs to enter home    Equipment Recommendations  None recommended by PT    Recommendations for Other Services     Frequency Min 5X/week    Precautions / Restrictions Precautions Precautions: None Restrictions Weight Bearing Restrictions: No   Pertinent Vitals/Pain **SaO2 90-92% on 6L O2 while walking 93% on 4L O2 at rest HR 80 at rest, 100 with walking 0/10 pain*      Mobility  Bed Mobility Bed Mobility: Supine to Sit Supine to Sit: 6: Modified independent (Device/Increase time);HOB elevated;With rails Sitting - Scoot to Edge of Bed: 6: Modified independent (Device/Increase time);With rail Transfers Transfers: Sit to Stand;Stand to Sit Sit to Stand: 5: Supervision;From bed Stand to Sit: 5: Supervision;To chair/3-in-1 Details for Transfer Assistance: verbal cues for hand placement  Ambulation/Gait Ambulation/Gait Assistance: 5: Supervision Ambulation Distance (Feet): 300 Feet Assistive device: None Gait Pattern: Decreased step length - right;Decreased step length - left General Gait Details: Pt ambulated with 6L O2. SaO2 90-92% while walking. HR 100 with walking, 80 at rest. Pt  took 2 brief standing rest breaks.  Stairs: No    Exercises Total Joint Exercises Long Arc Quad: AROM;Left;10 reps;Seated Marching in Standing: AROM;Left;10 reps;Seated   PT Diagnosis: Difficulty walking  PT Problem List: Cardiopulmonary status limiting activity;Decreased range of motion PT Treatment Interventions: Gait training;Stair training;Functional mobility training;Therapeutic exercise     PT Goals(Current goals can be found in the care plan section) Acute Rehab PT Goals Patient Stated Goal: Rehab and home to resume previous lifestyle with decreased pain PT Goal Formulation: With patient Time For Goal Achievement: 12/08/13 Potential to Achieve Goals: Good  Visit Information  Last PT Received On: 12/01/13 History of Present Illness: SBO, PNA, recent THA       Prior Functioning  Home Living Family/patient expects to be discharged to:: Skilled nursing facility Living Arrangements: Alone Prior Function Level of Independence: Independent Communication Communication: No difficulties Dominant Hand: Right    Cognition  Cognition Arousal/Alertness: Awake/alert Behavior During Therapy: WFL for tasks assessed/performed (although moves quickly at times) Overall Cognitive Status: Within Functional Limits for tasks assessed    Extremity/Trunk Assessment Upper Extremity Assessment Upper Extremity Assessment: Overall WFL for tasks assessed Lower Extremity Assessment Lower Extremity Assessment: LLE deficits/detail LLE Deficits / Details: L hip external rotation and hip flexion limited in sitting by distended abdomen; knee ext +4/5, ankle WNL, pt able to do SLR Cervical / Trunk Assessment Cervical / Trunk Assessment: Normal   Balance    End of Session PT - End of Session Equipment Utilized During Treatment: Gait belt;Oxygen Activity Tolerance: Patient tolerated treatment well Patient left: in chair;with call bell/phone within reach Nurse Communication: Mobility status  GP  Ralene Bathe Kistler 12/01/2013, 4:01 PM [phone]

## 2013-12-01 NOTE — Progress Notes (Signed)
Patient continues to drink water and eat ice ad lib, reminded that he has no bowel sounds, not passing gas, abdomen distended and taut.  Administered enema, patient assisted to bathroom.

## 2013-12-01 NOTE — Progress Notes (Signed)
Clinical Social Work Department BRIEF PSYCHOSOCIAL ASSESSMENT 12/01/2013  Patient:  Barry Morgan, Barry Morgan     Account Number:  [id number]     Admit date:  11/29/2013  Clinical Social Worker:  Jacelyn Grip  Date/Time:  12/01/2013 11:00 AM  Referred by:  Physician  Date Referred:  12/01/2013 Referred for  SNF Placement   Other Referral:   Interview type:  Patient Other interview type:    PSYCHOSOCIAL DATA Living Status:  FACILITY Admitted from facility:  [facility] Level of care:  Skilled Nursing Facility Primary support name:  Eliberto Ivory Moretto/[phone] Primary support relationship to patient:  CHILD, ADULT Degree of support available:   adequate    CURRENT CONCERNS Current Concerns  Post-Acute Placement   Other Concerns:    SOCIAL WORK ASSESSMENT / PLAN CSW received notification that pt admitted from [facility].    CSW met with pt at bedside. CSW introduced self and explained role. Pt confirmed that he admitted from [facility]. Pt discussed that he had only been at [facility] for one day before having to return to the hospital. Pt discussed how he is very eager for rehab as it wants to be able to climb stairs and put on his left sock. Pt confirmed that he plans to return to [facility] once medically stable from the hospital.    CSW contacted [facility] who confirmed that facility can accept pt back once pt medically stable from hospital.    CSW to continue to follow and facilitate pt discharge needs back to [facility] when pt medically ready for discharge.   Assessment/plan status:  Psychosocial Support/Ongoing Assessment of Needs Other assessment/ plan:   discharge planning   Information/referral to community resources:   Referral back to [facility]    PATIENT'S/FAMILY'S RESPONSE TO PLAN OF CARE: Pt alert and oriented x 4. Pt very pleasant and shared stories of his life. Pt motivated to get stronger and return home after rehab stay. Pt appreciative of  CSW visit and assistance.     Jacklynn Lewis, MSW, LCSW Clinical Social Work [phone]

## 2013-12-01 NOTE — Progress Notes (Signed)
Patient ID: Barry Morgan, male   DOB: [birth year], 72 y.o.   MRN: [id number] 1 Day Post-Op  Subjective: Pt looks really good today considering all that has happened.  No flatus yet.  Minimal NGT output.  Objective: Vital signs in last 24 hours: Temp:  [98.2 F (36.8 C)-98.4 F (36.9 C)] 98.2 F (36.8 C) (12/23 0400) Pulse Rate:  [71-87] 87 (12/23 0800) Resp:  [14-21] 18 (12/23 0800) BP: (134-171)/(52-93) 150/93 mmHg (12/23 0800) SpO2:  [91 %-100 %] 95 % (12/23 0800) FiO2 (%):  [100 %] 100 % (12/22 1000) Last BM Date: 11/24/13  Intake/Output from previous day: 12/22 0701 - 12/23 0700 In: 3803 [I.V.:3453; IV Piggyback:350] Out: 2560 [Urine:2485; Blood:75] Intake/Output this shift:    PE: Abd: soft, but distended, absent BS, incision c/d/i in Left groin.  NGT with minimal output.  Lab Results:   Recent Labs  11/30/13 1012 12/01/13 0340  WBC 5.7 6.3  HGB 11.9* 9.7*  HCT 36.4* 29.8*  PLT 214 213   BMET  Recent Labs  11/30/13 0906 12/01/13 0340  NA 137 142  K 4.3 4.5  CL 94* 102  CO2 30 35*  GLUCOSE 180* 122*  BUN 33* 39*  CREATININE 1.35 1.28  CALCIUM 8.8 7.8*   PT/INR  Recent Labs  11/30/13 0906  LABPROT 12.7  INR 0.97   CMP     Component Value Date/Time   NA 142 12/01/2013 0340   K 4.5 12/01/2013 0340   CL 102 12/01/2013 0340   CO2 35* 12/01/2013 0340   GLUCOSE 122* 12/01/2013 0340   BUN 39* 12/01/2013 0340   CREATININE 1.28 12/01/2013 0340   CALCIUM 7.8* 12/01/2013 0340   PROT 6.2 11/30/2013 0906   ALBUMIN 2.9* 11/30/2013 0906   AST 43* 11/30/2013 0906   ALT 37 11/30/2013 0906   ALKPHOS 61 11/30/2013 0906   BILITOT 0.9 11/30/2013 0906   GFRNONAA 54* 12/01/2013 0340   GFRAA 63* 12/01/2013 0340   Lipase  No results found for this basename: lipase       Studies/Results: Ct Angio Chest Pe W/cm &/or Wo Cm  11/30/2013   CLINICAL DATA:  Chest pain, constipation and decreased O2 saturation. History of smoking.  EXAM: CT ANGIOGRAPHY CHEST   CT ABDOMEN AND PELVIS WITH CONTRAST  TECHNIQUE: Multidetector CT imaging of the chest was performed using the standard protocol during bolus administration of intravenous contrast. Multiplanar CT image reconstructions including MIPs were obtained to evaluate the vascular anatomy. Multidetector CT imaging of the abdomen and pelvis was performed using the standard protocol during bolus administration of intravenous contrast.  CONTRAST:  OMNIPAQUE IOHEXOL 350 MG/ML SOLN  COMPARISON:  Chest and abdominal radiographs performed 11/29/2013  FINDINGS: CTA CHEST FINDINGS  There is no evidence of pulmonary embolus. There is no evidence of aortic dissection. The thoracic aorta is unremarkable in appearance. The proximal great vessels are within normal limits.  Fluffy bilateral airspace opacification is noted in the lower lung lobes and to a lesser extent within the posterior right upper lobe. Airspace opacification is most dense at the left lower lobe. Findings are compatible with multifocal pneumonia. There is no evidence of pleural effusion or pneumothorax. No masses are identified; no abnormal focal contrast enhancement is seen.  The mediastinum is unremarkable in appearance, aside diffuse coronary artery calcification. No mediastinal lymphadenopathy is seen. No pericardial effusion is identified.  Incidental note is made of diffuse distention of the esophagus, with dilatation of the distal esophagus with fluid. This may  reflect esophageal dysmotility, with an associated small to moderate hiatal hernia. No axillary lymphadenopathy is seen. The thyroid gland is unremarkable in appearance.  No acute osseous abnormalities are seen. Healed right-sided rib fractures are noted. Mild degenerative change is noted at the lower cervical spine.  CT ABDOMEN and PELVIS FINDINGS  There is no evidence of aortic dissection. Scattered calcific atherosclerotic disease is noted along the abdominal aorta, without evidence of aneurysmal  dilatation.  Scattered hypodensities are noted within the liver, measuring up to 2.5 cm in size. These are nonspecific, but may reflect cysts. The spleen is unremarkable in appearance. The gallbladder is within normal limits. The pancreas and adrenal glands are unremarkable.  Nonspecific perinephric stranding is noted bilaterally. The kidneys are otherwise unremarkable in appearance. Contrast is noted filling the renal calyces, limiting evaluation for renal stones. No obstructing ureteral stones are seen. There is no evidence of hydronephrosis.  The colon is diffusely distended with fluid, with the cecum measuring up to 11.1 cm. There is gradual fecalization of the distal descending colon. This appears to reflect distal obstruction due to a small to moderate left inguinal hernia, containing a segment of mildly inflamed proximal sigmoid colon. Trace associated free fluid is seen. The small bowel is unremarkable in appearance. The stomach is within normal limits. No acute vascular abnormalities are seen.  The appendix is unremarkable in appearance; there is no evidence for appendicitis.  The bladder is mildly distended and grossly unremarkable the prostate remains normal in size, with scattered calcification. Postoperative calcifications are seen about the left hip, with associated left hip prosthesis. The prosthesis is incompletely imaged on this study. No inguinal lymphadenopathy is seen.  No acute osseous abnormalities are identified. Vacuum phenomenon is noted at L4-5.  Review of the MIP images confirms the above findings.  IMPRESSION: 1. Distal obstruction at the level of the proximal sigmoid colon, due to a small to moderate left inguinal hernia, containing a segment of mildly inflamed proximal sigmoid colon. Diffuse distention of the colon with fluid, measuring up to 11.1 cm at the cecum, with gradual fecalization of the distal descending colon. Manual reduction may be possible, though difficult. 2. Multifocal  pneumonia noted, most prominent at the lower lung lobes, particularly on the left. 3. No evidence of pulmonary embolus. No evidence of aortic dissection. 4. Diffuse distention of the esophagus, filled with fluid, with an associated small to moderate hiatal hernia. This raises concern for chronic esophageal dysmotility. 5. Scattered calcific atherosclerotic disease noted along the abdominal aorta. 6. Scattered likely hepatic cysts seen.  These results were called by telephone at the time of interpretation on 11/30/2013 at 4:18 AM to Dr. Blane Ohara, who verbally acknowledged these results.   Electronically Signed   By: Roanna Raider M.D.   On: 11/30/2013 04:32   Ct Abdomen Pelvis W Contrast  11/30/2013   CLINICAL DATA:  Chest pain, constipation and decreased O2 saturation. History of smoking.  EXAM: CT ANGIOGRAPHY CHEST  CT ABDOMEN AND PELVIS WITH CONTRAST  TECHNIQUE: Multidetector CT imaging of the chest was performed using the standard protocol during bolus administration of intravenous contrast. Multiplanar CT image reconstructions including MIPs were obtained to evaluate the vascular anatomy. Multidetector CT imaging of the abdomen and pelvis was performed using the standard protocol during bolus administration of intravenous contrast.  CONTRAST:  OMNIPAQUE IOHEXOL 350 MG/ML SOLN  COMPARISON:  Chest and abdominal radiographs performed 11/29/2013  FINDINGS: CTA CHEST FINDINGS  There is no evidence of pulmonary embolus. There  is no evidence of aortic dissection. The thoracic aorta is unremarkable in appearance. The proximal great vessels are within normal limits.  Fluffy bilateral airspace opacification is noted in the lower lung lobes and to a lesser extent within the posterior right upper lobe. Airspace opacification is most dense at the left lower lobe. Findings are compatible with multifocal pneumonia. There is no evidence of pleural effusion or pneumothorax. No masses are identified; no abnormal  focal contrast enhancement is seen.  The mediastinum is unremarkable in appearance, aside diffuse coronary artery calcification. No mediastinal lymphadenopathy is seen. No pericardial effusion is identified.  Incidental note is made of diffuse distention of the esophagus, with dilatation of the distal esophagus with fluid. This may reflect esophageal dysmotility, with an associated small to moderate hiatal hernia. No axillary lymphadenopathy is seen. The thyroid gland is unremarkable in appearance.  No acute osseous abnormalities are seen. Healed right-sided rib fractures are noted. Mild degenerative change is noted at the lower cervical spine.  CT ABDOMEN and PELVIS FINDINGS  There is no evidence of aortic dissection. Scattered calcific atherosclerotic disease is noted along the abdominal aorta, without evidence of aneurysmal dilatation.  Scattered hypodensities are noted within the liver, measuring up to 2.5 cm in size. These are nonspecific, but may reflect cysts. The spleen is unremarkable in appearance. The gallbladder is within normal limits. The pancreas and adrenal glands are unremarkable.  Nonspecific perinephric stranding is noted bilaterally. The kidneys are otherwise unremarkable in appearance. Contrast is noted filling the renal calyces, limiting evaluation for renal stones. No obstructing ureteral stones are seen. There is no evidence of hydronephrosis.  The colon is diffusely distended with fluid, with the cecum measuring up to 11.1 cm. There is gradual fecalization of the distal descending colon. This appears to reflect distal obstruction due to a small to moderate left inguinal hernia, containing a segment of mildly inflamed proximal sigmoid colon. Trace associated free fluid is seen. The small bowel is unremarkable in appearance. The stomach is within normal limits. No acute vascular abnormalities are seen.  The appendix is unremarkable in appearance; there is no evidence for appendicitis.  The  bladder is mildly distended and grossly unremarkable the prostate remains normal in size, with scattered calcification. Postoperative calcifications are seen about the left hip, with associated left hip prosthesis. The prosthesis is incompletely imaged on this study. No inguinal lymphadenopathy is seen.  No acute osseous abnormalities are identified. Vacuum phenomenon is noted at L4-5.  Review of the MIP images confirms the above findings.  IMPRESSION: 1. Distal obstruction at the level of the proximal sigmoid colon, due to a small to moderate left inguinal hernia, containing a segment of mildly inflamed proximal sigmoid colon. Diffuse distention of the colon with fluid, measuring up to 11.1 cm at the cecum, with gradual fecalization of the distal descending colon. Manual reduction may be possible, though difficult. 2. Multifocal pneumonia noted, most prominent at the lower lung lobes, particularly on the left. 3. No evidence of pulmonary embolus. No evidence of aortic dissection. 4. Diffuse distention of the esophagus, filled with fluid, with an associated small to moderate hiatal hernia. This raises concern for chronic esophageal dysmotility. 5. Scattered calcific atherosclerotic disease noted along the abdominal aorta. 6. Scattered likely hepatic cysts seen.  These results were called by telephone at the time of interpretation on 11/30/2013 at 4:18 AM to Dr. Blane Ohara, who verbally acknowledged these results.   Electronically Signed   By: Roanna Raider M.D.   On:  11/30/2013 04:32   Dg Chest Port 1 View  12/01/2013   CLINICAL DATA:  Pneumonia.  EXAM: PORTABLE CHEST - 1 VIEW  COMPARISON:  Chest CT 11/30/2013.  Chest x-ray 11/19/2013.  FINDINGS: NG tube present with tip below the left hemidiaphragm. Mediastinum and hilar structures normal. Patchy bilateral pulmonary infiltrates are noted particularly in the left lower lobe. Left lower lobe infiltrate is prominent. No evidence of pneumothorax. Heart size and  pulmonary vascularity normal. No acute osseous abnormality.  IMPRESSION: 1. Bilateral pulmonary infiltrates particularly in the left lower lobe. Left lower lobe infiltrate is prominent. Findings consistent with pneumonia. 2. NG tube noted with tip below left hemidiaphragm.   Electronically Signed   By: Maisie Fus  Register   On: 12/01/2013 07:09   Dg Abd Acute W/chest  11/29/2013   CLINICAL DATA:  Constipation and vomiting. Recent left total hip arthroplasty.  EXAM: ACUTE ABDOMEN SERIES (ABDOMEN 2 VIEW & CHEST 1 VIEW)  COMPARISON:  Pelvis radiograph performed 11/25/2013  FINDINGS: The lungs are relatively well-aerated and clear. There is no evidence of focal opacification, pleural effusion or pneumothorax. The cardiomediastinal silhouette is within normal limits.  The visualized bowel gas pattern is nonspecific. Scattered fluid and air are seen within the small bowel, with a few small air-fluid levels, and scattered stool is noted within the colon; there is no evidence of small bowel dilatation to suggest obstruction. No free intra-abdominal air is identified on the provided decubitus view.  There is suggestion of increased lucency about the socket of the patient's left hip arthroplasty. If the patient has associated significant symptoms, dedicated left hip radiographs could be considered for further evaluation.  A few tiny stones are noted at the interpole region of the left kidney.  IMPRESSION: 1. Nonspecific bowel gas pattern; no evidence of bowel dilatation to suggest obstruction, though a few small air-fluid levels are seen. No free intra-abdominal air identified. 2. No acute cardiopulmonary process seen. 3. Suggestion of increased lucency about the site of the patient's left hip arthroplasty. If the patient has significant associated left hip symptoms, dedicated left hip radiographs could be considered for further evaluation. 4. Tiny left renal stones incidentally noted.   Electronically Signed   By: Roanna Raider M.D.   On: 11/29/2013 23:50   Dg Abd Portable 1v  11/30/2013   CLINICAL DATA:  Nasogastric tube placement  EXAM: PORTABLE ABDOMEN - 1 VIEW  COMPARISON:  See CT abdomen and pelvis November 30, 2013  FINDINGS: Nasogastric tube tip and side port are in the proximal stomach ; the side port is just below the gastroesophageal junction. Most bowel loops are fluid-filled as is noted on CT obtained earlier in the day. No free air is seen on this examination. There is atelectasis in the left lung base.  IMPRESSION: Nasogastric tube tip and side port in proximal stomach. Most bowel loops appear fluid-filled.   Electronically Signed   By: Bretta Bang M.D.   On: 11/30/2013 06:58    Anti-infectives: Anti-infectives   Start     Dose/Rate Route Frequency Ordered Stop   11/30/13 2200  vancomycin (VANCOCIN) 1,250 mg in sodium chloride 0.9 % 250 mL IVPB     1,250 mg 166.7 mL/hr over 90 Minutes Intravenous Every 12 hours 11/30/13 0831     11/30/13 1400  piperacillin-tazobactam (ZOSYN) IVPB 3.375 g     3.375 g 12.5 mL/hr over 240 Minutes Intravenous Every 8 hours 11/30/13 0831     11/30/13 0500  vancomycin (VANCOCIN) 1,500 mg  in sodium chloride 0.9 % 500 mL IVPB     1,500 mg 250 mL/hr over 120 Minutes Intravenous  Once 11/30/13 0433 11/30/13 0856   11/30/13 0445  piperacillin-tazobactam (ZOSYN) IVPB 3.375 g     3.375 g 100 mL/hr over 30 Minutes Intravenous  Once 11/30/13 0433 11/30/13 0726       Assessment/Plan  1. POD1, s/p repair of incarcerated left inguinal hernia 2. SBO secondary to #1 3. Post op ileus  Plan: 1. Due to minimal NGT output, will dc NGT today, but must stay NPO until bowel function returns 2. Dc foley 3. Mobilize with PT 4. Patient is surgically stable for transfer to the floor, defer to primary service.   LOS: 2 days    Guy Toney E 12/01/2013, 8:41 AM Pager: [phone]

## 2013-12-01 NOTE — Consult Note (Signed)
Name: [facility] MRN: [id number] DOB: March 24, 1941    ADMISSION DATE:  11/29/2013 CONSULTATION DATE:  12-22  REFERRING MD :  Triad PRIMARY SERVICE: [facility]  CHIEF COMPLAINT:  Abd. Pain, hypoxia  BRIEF PATIENT DESCRIPTION:  [name] who had left  ant hip replacement 12-17 and presents to [facility] with abdominal pain and found to incarcerated hernia with bowel obstruction and CT of chest with bilateral pneumonia . He has a PMH significant for CAD and OA. PCCM asked to evaluate and due to hid FIO2 needs, pneumonia he will most likely need post operative ventilator support.  SIGNIFICANT EVENTS / STUDIES:  12-22 bowel obstruction 12-22 REPAIR of Recurrent INCARCERATED Left HERNIA INGUINAL, INSERTION OF MESH  LINES / TUBES:   CULTURES: 12-22 bc x 2>>gpc/pairs/chains>>  ANTIBIOTICS: 12-22 vanc>> 12-22 pip-tazo>>  HISTORY OF PRESENT ILLNESS:   [name] who had left  ant hip replacement 12-17 and presents to [facility] with abdominal pain and found to incarcerated hernia with bowel obstruction and CT of chest with bilateral pneumonia . He has a PMH significant for CAD and OA. PCCM asked to evaluate and due to his FIO2 needs, pneumonia he will most likely need post operative ventilator support.  SUBJECTIVE:  Awake and alert, did not require vent post op. VITAL SIGNS: Temp:  [98.2 F (36.8 C)-98.4 F (36.9 C)] 98.2 F (36.8 C) (12/23 0400) Pulse Rate:  [71-84] 74 (12/23 0200) Resp:  [14-21] 16 (12/23 0200) BP: (134-171)/(52-93) 149/60 mmHg (12/23 0400) SpO2:  [91 %-100 %] 98 % (12/23 0728) FiO2 (%):  [100 %] 100 % (12/22 1000) Weight:  [232 lb 12.9 oz (105.6 kg)] 232 lb 12.9 oz (105.6 kg) (12/22 0815) HEMODYNAMICS:   VENTILATOR SETTINGS: Vent Mode:  [-]  FiO2 (%):  [100 %] 100 % INTAKE / OUTPUT: Intake/Output     12/22 0701 - 12/23 0700 12/23 0701 - 12/24 0700   I.V. (mL/kg) 3453 (32.7)    IV Piggyback 350    Total Intake(mL/kg) 3803 (36)    Urine (mL/kg/hr) 2485 (1)    Blood 75  (0)    Total Output 2560     Net +1243            PHYSICAL EXAMINATION: General:  WNWDWM Neuro:  Intact, awake and follows commands HEENT:  No JVD/LAN. Rt eye prosthesis Cardiovascular:  HSR RRR Lungs:  CTA but diminished in bases, faint crackles Abdomen:  Tender, dressing left inguinal area intact Musculoskeletal:  RT hip wound unremakable Skin:  warm  LABS:  CBC  Recent Labs Lab 11/29/13 2325 11/30/13 1012 12/01/13 0340  WBC 9.9 5.7 6.3  HGB 11.2* 11.9* 9.7*  HCT 32.9* 36.4* 29.8*  PLT 227 214 213   Coag's  Recent Labs Lab 11/30/13 0906  INR 0.97   BMET  Recent Labs Lab 11/29/13 2325 11/30/13 0906 12/01/13 0340  NA 135 137 142  K 3.5 4.3 4.5  CL 93* 94* 102  CO2 32 30 35*  BUN 26* 33* 39*  CREATININE 1.00 1.35 1.28  GLUCOSE 188* 180* 122*   Electrolytes  Recent Labs Lab 11/29/13 2325 11/30/13 0906 12/01/13 0340  CALCIUM 9.2 8.8 7.8*  MG  --   --  4.6*  PHOS  --   --  5.0*   Sepsis Markers  Recent Labs Lab 11/30/13 0444  LATICACIDVEN 3.3*   ABG  Recent Labs Lab 12/01/13 0525  PHART 7.470*  PCO2ART 45.3*  PO2ART 77.5*   Liver Enzymes  Recent  Labs Lab 11/30/13 0906  AST 43*  ALT 37  ALKPHOS 61  BILITOT 0.9  ALBUMIN 2.9*   Cardiac Enzymes No results found for this basename: TROPONINI, PROBNP,  in the last 168 hours Glucose  Recent Labs Lab 11/30/13 1006 11/30/13 1400 11/30/13 1617 11/30/13 2015 11/30/13 2349 12/01/13 0345  GLUCAP 146* 133* 136* 124* 119* 105*    Imaging Ct Angio Chest Pe W/cm &/or Wo Cm  11/30/2013   CLINICAL DATA:  Chest pain, constipation and decreased O2 saturation. History of smoking.  EXAM: CT ANGIOGRAPHY CHEST  CT ABDOMEN AND PELVIS WITH CONTRAST  TECHNIQUE: Multidetector CT imaging of the chest was performed using the standard protocol during bolus administration of intravenous contrast. Multiplanar CT image reconstructions including MIPs were obtained to evaluate the vascular anatomy.  Multidetector CT imaging of the abdomen and pelvis was performed using the standard protocol during bolus administration of intravenous contrast.  CONTRAST:  OMNIPAQUE IOHEXOL 350 MG/ML SOLN  COMPARISON:  Chest and abdominal radiographs performed 11/29/2013  FINDINGS: CTA CHEST FINDINGS  There is no evidence of pulmonary embolus. There is no evidence of aortic dissection. The thoracic aorta is unremarkable in appearance. The proximal great vessels are within normal limits.  Fluffy bilateral airspace opacification is noted in the lower lung lobes and to a lesser extent within the posterior right upper lobe. Airspace opacification is most dense at the left lower lobe. Findings are compatible with multifocal pneumonia. There is no evidence of pleural effusion or pneumothorax. No masses are identified; no abnormal focal contrast enhancement is seen.  The mediastinum is unremarkable in appearance, aside diffuse coronary artery calcification. No mediastinal lymphadenopathy is seen. No pericardial effusion is identified.  Incidental note is made of diffuse distention of the esophagus, with dilatation of the distal esophagus with fluid. This may reflect esophageal dysmotility, with an associated small to moderate hiatal hernia. No axillary lymphadenopathy is seen. The thyroid gland is unremarkable in appearance.  No acute osseous abnormalities are seen. Healed right-sided rib fractures are noted. Mild degenerative change is noted at the lower cervical spine.  CT ABDOMEN and PELVIS FINDINGS  There is no evidence of aortic dissection. Scattered calcific atherosclerotic disease is noted along the abdominal aorta, without evidence of aneurysmal dilatation.  Scattered hypodensities are noted within the liver, measuring up to 2.5 cm in size. These are nonspecific, but may reflect cysts. The spleen is unremarkable in appearance. The gallbladder is within normal limits. The pancreas and adrenal glands are unremarkable.   Nonspecific perinephric stranding is noted bilaterally. The kidneys are otherwise unremarkable in appearance. Contrast is noted filling the renal calyces, limiting evaluation for renal stones. No obstructing ureteral stones are seen. There is no evidence of hydronephrosis.  The colon is diffusely distended with fluid, with the cecum measuring up to 11.1 cm. There is gradual fecalization of the distal descending colon. This appears to reflect distal obstruction due to a small to moderate left inguinal hernia, containing a segment of mildly inflamed proximal sigmoid colon. Trace associated free fluid is seen. The small bowel is unremarkable in appearance. The stomach is within normal limits. No acute vascular abnormalities are seen.  The appendix is unremarkable in appearance; there is no evidence for appendicitis.  The bladder is mildly distended and grossly unremarkable the prostate remains normal in size, with scattered calcification. Postoperative calcifications are seen about the left hip, with associated left hip prosthesis. The prosthesis is incompletely imaged on this study. No inguinal lymphadenopathy is seen.  No  acute osseous abnormalities are identified. Vacuum phenomenon is noted at L4-5.  Review of the MIP images confirms the above findings.  IMPRESSION: 1. Distal obstruction at the level of the proximal sigmoid colon, due to a small to moderate left inguinal hernia, containing a segment of mildly inflamed proximal sigmoid colon. Diffuse distention of the colon with fluid, measuring up to 11.1 cm at the cecum, with gradual fecalization of the distal descending colon. Manual reduction may be possible, though difficult. 2. Multifocal pneumonia noted, most prominent at the lower lung lobes, particularly on the left. 3. No evidence of pulmonary embolus. No evidence of aortic dissection. 4. Diffuse distention of the esophagus, filled with fluid, with an associated small to moderate hiatal hernia. This raises  concern for chronic esophageal dysmotility. 5. Scattered calcific atherosclerotic disease noted along the abdominal aorta. 6. Scattered likely hepatic cysts seen.  These results were called by telephone at the time of interpretation on 11/30/2013 at 4:18 AM to Dr. Blane Ohara, who verbally acknowledged these results.   Electronically Signed   By: Roanna Raider M.D.   On: 11/30/2013 04:32   Ct Abdomen Pelvis W Contrast  11/30/2013   CLINICAL DATA:  Chest pain, constipation and decreased O2 saturation. History of smoking.  EXAM: CT ANGIOGRAPHY CHEST  CT ABDOMEN AND PELVIS WITH CONTRAST  TECHNIQUE: Multidetector CT imaging of the chest was performed using the standard protocol during bolus administration of intravenous contrast. Multiplanar CT image reconstructions including MIPs were obtained to evaluate the vascular anatomy. Multidetector CT imaging of the abdomen and pelvis was performed using the standard protocol during bolus administration of intravenous contrast.  CONTRAST:  OMNIPAQUE IOHEXOL 350 MG/ML SOLN  COMPARISON:  Chest and abdominal radiographs performed 11/29/2013  FINDINGS: CTA CHEST FINDINGS  There is no evidence of pulmonary embolus. There is no evidence of aortic dissection. The thoracic aorta is unremarkable in appearance. The proximal great vessels are within normal limits.  Fluffy bilateral airspace opacification is noted in the lower lung lobes and to a lesser extent within the posterior right upper lobe. Airspace opacification is most dense at the left lower lobe. Findings are compatible with multifocal pneumonia. There is no evidence of pleural effusion or pneumothorax. No masses are identified; no abnormal focal contrast enhancement is seen.  The mediastinum is unremarkable in appearance, aside diffuse coronary artery calcification. No mediastinal lymphadenopathy is seen. No pericardial effusion is identified.  Incidental note is made of diffuse distention of the esophagus, with  dilatation of the distal esophagus with fluid. This may reflect esophageal dysmotility, with an associated small to moderate hiatal hernia. No axillary lymphadenopathy is seen. The thyroid gland is unremarkable in appearance.  No acute osseous abnormalities are seen. Healed right-sided rib fractures are noted. Mild degenerative change is noted at the lower cervical spine.  CT ABDOMEN and PELVIS FINDINGS  There is no evidence of aortic dissection. Scattered calcific atherosclerotic disease is noted along the abdominal aorta, without evidence of aneurysmal dilatation.  Scattered hypodensities are noted within the liver, measuring up to 2.5 cm in size. These are nonspecific, but may reflect cysts. The spleen is unremarkable in appearance. The gallbladder is within normal limits. The pancreas and adrenal glands are unremarkable.  Nonspecific perinephric stranding is noted bilaterally. The kidneys are otherwise unremarkable in appearance. Contrast is noted filling the renal calyces, limiting evaluation for renal stones. No obstructing ureteral stones are seen. There is no evidence of hydronephrosis.  The colon is diffusely distended with fluid, with the  cecum measuring up to 11.1 cm. There is gradual fecalization of the distal descending colon. This appears to reflect distal obstruction due to a small to moderate left inguinal hernia, containing a segment of mildly inflamed proximal sigmoid colon. Trace associated free fluid is seen. The small bowel is unremarkable in appearance. The stomach is within normal limits. No acute vascular abnormalities are seen.  The appendix is unremarkable in appearance; there is no evidence for appendicitis.  The bladder is mildly distended and grossly unremarkable the prostate remains normal in size, with scattered calcification. Postoperative calcifications are seen about the left hip, with associated left hip prosthesis. The prosthesis is incompletely imaged on this study. No inguinal  lymphadenopathy is seen.  No acute osseous abnormalities are identified. Vacuum phenomenon is noted at L4-5.  Review of the MIP images confirms the above findings.  IMPRESSION: 1. Distal obstruction at the level of the proximal sigmoid colon, due to a small to moderate left inguinal hernia, containing a segment of mildly inflamed proximal sigmoid colon. Diffuse distention of the colon with fluid, measuring up to 11.1 cm at the cecum, with gradual fecalization of the distal descending colon. Manual reduction may be possible, though difficult. 2. Multifocal pneumonia noted, most prominent at the lower lung lobes, particularly on the left. 3. No evidence of pulmonary embolus. No evidence of aortic dissection. 4. Diffuse distention of the esophagus, filled with fluid, with an associated small to moderate hiatal hernia. This raises concern for chronic esophageal dysmotility. 5. Scattered calcific atherosclerotic disease noted along the abdominal aorta. 6. Scattered likely hepatic cysts seen.  These results were called by telephone at the time of interpretation on 11/30/2013 at 4:18 AM to Dr. Blane Ohara, who verbally acknowledged these results.   Electronically Signed   By: Roanna Raider M.D.   On: 11/30/2013 04:32   Dg Chest Port 1 View  12/01/2013   CLINICAL DATA:  Pneumonia.  EXAM: PORTABLE CHEST - 1 VIEW  COMPARISON:  Chest CT 11/30/2013.  Chest x-ray 11/19/2013.  FINDINGS: NG tube present with tip below the left hemidiaphragm. Mediastinum and hilar structures normal. Patchy bilateral pulmonary infiltrates are noted particularly in the left lower lobe. Left lower lobe infiltrate is prominent. No evidence of pneumothorax. Heart size and pulmonary vascularity normal. No acute osseous abnormality.  IMPRESSION: 1. Bilateral pulmonary infiltrates particularly in the left lower lobe. Left lower lobe infiltrate is prominent. Findings consistent with pneumonia. 2. NG tube noted with tip below left hemidiaphragm.    Electronically Signed   By: Maisie Fus  Register   On: 12/01/2013 07:09   Dg Abd Acute W/chest  11/29/2013   CLINICAL DATA:  Constipation and vomiting. Recent left total hip arthroplasty.  EXAM: ACUTE ABDOMEN SERIES (ABDOMEN 2 VIEW & CHEST 1 VIEW)  COMPARISON:  Pelvis radiograph performed 11/25/2013  FINDINGS: The lungs are relatively well-aerated and clear. There is no evidence of focal opacification, pleural effusion or pneumothorax. The cardiomediastinal silhouette is within normal limits.  The visualized bowel gas pattern is nonspecific. Scattered fluid and air are seen within the small bowel, with a few small air-fluid levels, and scattered stool is noted within the colon; there is no evidence of small bowel dilatation to suggest obstruction. No free intra-abdominal air is identified on the provided decubitus view.  There is suggestion of increased lucency about the socket of the patient's left hip arthroplasty. If the patient has associated significant symptoms, dedicated left hip radiographs could be considered for further evaluation.  A few tiny stones  are noted at the interpole region of the left kidney.  IMPRESSION: 1. Nonspecific bowel gas pattern; no evidence of bowel dilatation to suggest obstruction, though a few small air-fluid levels are seen. No free intra-abdominal air identified. 2. No acute cardiopulmonary process seen. 3. Suggestion of increased lucency about the site of the patient's left hip arthroplasty. If the patient has significant associated left hip symptoms, dedicated left hip radiographs could be considered for further evaluation. 4. Tiny left renal stones incidentally noted.   Electronically Signed   By: Roanna Raider M.D.   On: 11/29/2013 23:50   Dg Abd Portable 1v  11/30/2013   CLINICAL DATA:  Nasogastric tube placement  EXAM: PORTABLE ABDOMEN - 1 VIEW  COMPARISON:  See CT abdomen and pelvis November 30, 2013  FINDINGS: Nasogastric tube tip and side port are in the proximal  stomach ; the side port is just below the gastroesophageal junction. Most bowel loops are fluid-filled as is noted on CT obtained earlier in the day. No free air is seen on this examination. There is atelectasis in the left lung base.  IMPRESSION: Nasogastric tube tip and side port in proximal stomach. Most bowel loops appear fluid-filled.   Electronically Signed   By: Bretta Bang M.D.   On: 11/30/2013 06:58   ASSESSMENT / PLAN:  PULMONARY A:Hypoxia, pnuemonia P:   - Titrate O2 as needed. - BD's as ordered. -Moblize as tolerated  CARDIOVASCULAR A: Hx of CAD P:  - Cardiac monitoring in ICU - Check 12 lead for baseline ->NAD  RENAL Lab Results  Component Value Date   CREATININE 1.28 12/01/2013   CREATININE 1.35 11/30/2013   CREATININE 1.00 11/29/2013    Recent Labs Lab 11/29/13 2325 11/30/13 0906 12/01/13 0340  K 3.5 4.3 4.5     A: No acute process P:   - Recheck BMET and replace electrolytes as indicated.  GASTROINTESTINAL A:  Bowel obstruction, incarcerated herina  P:   - PPI. - Per CCS. - NPO, will start TF when ok with CCS.  HEMATOLOGIC A:  No acute issue P:  - Daily CBC, transfusion as per protocol.  INFECTIOUS A:  Presumed pneumonia        Bacteriemia gpc  P:   - See flow sheet.  Will maintain abx for now. -recheck lactic acid 12-23  ENDOCRINE A: No Acute process P:   - Monitor for now.  NEUROLOGIC A: No acute process P:   TODAY'S SUMMARY:   [name] who had left  ant hip replacement 12-17 and presents to [facility] with abdominal pain and found to incarcerated hernia with bowel obstruction and CT of chest with bilateral pneumonia . He has a PMH significant for CAD and OA. PCCM asked to evaluate and due to his FIO2 needs. 12-23 not on ventilator. CxR better. GPC in blood  Brett Canales Minor ACNP Ad[facility]ack PCCM Pager [phone] till 3 pm If no answer page [phone] 12/01/2013, 8:14 AM  Ok to transfer to floor, continue treatment for PNA, PCCM will  sign off, please call back if needed.  Patient seen and examined, agree with above note.  I dictated the care and orders written for this patient under my direction.  Alyson Reedy, MD [phone]

## 2013-12-01 NOTE — Progress Notes (Signed)
CARE MANAGEMENT NOTE 12/01/2013  Patient:  [facility], [facility]   Account Number:  [id number]  Date Initiated:  12/01/2013  Documentation initiated by:  DAVIS,RHONDA  Subjective/Objective Assessment:   pt with recent history of orif and now present with incarerate hernia and sbo     Action/Plan:   transfered to [facility] due to hypoxia and requiring  o2 at 40% via [facility]rb and then [place]  from assisted living at [facility]   Anticipated DC Date:  12/04/2013   Anticipated DC Plan:  SKILLED NURSING FACILITY  In-house referral  Clinical Social Worker      DC Planning Services  NA      [facility] Choice  NA   Choice offered to / List presented to:  NA   DME arranged  NA      DME agency  NA     HH arranged  NA      HH agency  NA   Status of service:  In process, will continue to follow Medicare Important Message given?  NA - LOS <3 / Initial given by admissions (If response is "NO", the following Medicare IM given date fields will be blank) Date Medicare IM given:   Date Additional Medicare IM given:    Discharge Disposition:    Per UR Regulation:  Reviewed for med. necessity/level of care/duration of stay  If discussed at Long Length of Stay Meetings, dates discussed:    Comments:  [id number]/Rhonda Stark Jock, BSN, [place] [phone] Chart Reviewed for discharge and hospital needs. Discharge needs at time of review:  None present will follow for needs. Review of patient progress due on [id number].

## 2013-12-02 ENCOUNTER — Inpatient Hospital Stay (HOSPITAL_COMMUNITY): Payer: Medicare Other

## 2013-12-02 DIAGNOSIS — R739 Hyperglycemia, unspecified: Secondary | ICD-10-CM | POA: Diagnosis present

## 2013-12-02 DIAGNOSIS — Z96649 Presence of unspecified artificial hip joint: Secondary | ICD-10-CM

## 2013-12-02 DIAGNOSIS — K219 Gastro-esophageal reflux disease without esophagitis: Secondary | ICD-10-CM | POA: Diagnosis present

## 2013-12-02 DIAGNOSIS — R52 Pain, unspecified: Secondary | ICD-10-CM

## 2013-12-02 DIAGNOSIS — Z87891 Personal history of nicotine dependence: Secondary | ICD-10-CM

## 2013-12-02 DIAGNOSIS — R109 Unspecified abdominal pain: Secondary | ICD-10-CM

## 2013-12-02 DIAGNOSIS — R7881 Bacteremia: Secondary | ICD-10-CM

## 2013-12-02 DIAGNOSIS — K4030 Unilateral inguinal hernia, with obstruction, without gangrene, not specified as recurrent: Secondary | ICD-10-CM

## 2013-12-02 DIAGNOSIS — B952 Enterococcus as the cause of diseases classified elsewhere: Secondary | ICD-10-CM

## 2013-12-02 HISTORY — DX: Unilateral inguinal hernia, with obstruction, without gangrene, not specified as recurrent: K40.30

## 2013-12-02 LAB — BASIC METABOLIC PANEL
Calcium: 7.6 mg/dL — ABNORMAL LOW (ref 8.4–10.5)
Chloride: 99 mEq/L (ref 96–112)
GFR calc Af Amer: 75 mL/min — ABNORMAL LOW (ref >90)
GFR calc non Af Amer: 64 mL/min — ABNORMAL LOW (ref >90)
Sodium: 136 mEq/L (ref 135–145)

## 2013-12-02 LAB — CBC
MCH: 30.8 pg (ref 26.0–34.0)
MCHC: 32.6 g/dL (ref 30.0–36.0)
Platelets: 209 10*3/uL (ref 150–400)

## 2013-12-02 MED ORDER — HYDROCODONE-ACETAMINOPHEN 5-325 MG PO TABS
1.0000 | ORAL_TABLET | ORAL | Status: DC | PRN
  Administered 2013-12-05: 1 via ORAL
  Filled 2013-12-02: qty 2
  Filled 2013-12-02: qty 1

## 2013-12-02 MED ORDER — LEVALBUTEROL HCL 0.63 MG/3ML IN NEBU
0.6300 mg | INHALATION_SOLUTION | Freq: Four times a day (QID) | RESPIRATORY_TRACT | Status: DC
  Administered 2013-12-02 – 2013-12-05 (×8): 0.63 mg via RESPIRATORY_TRACT
  Filled 2013-12-02 (×18): qty 3

## 2013-12-02 NOTE — Progress Notes (Signed)
Clinical Social Work  Per MD, patient not medically stable to DC. Facility unable to accept patient on Christmas Day without DC summary today. CSW updated SNF and MD reports if patient is stable, possible DC on Friday. CSW will continue to follow.  [place], [place] [phone]

## 2013-12-02 NOTE — Progress Notes (Signed)
General Surgery Note  LOS: 3 days  POD -  2 Days Post-Op  Assessment/Plan: 1.  Left HERNIA REPAIR INGUINAL INCARCERATED,  INSERTION OF MESH - 11/30/2013 - D. Kendi Defalco  Had BM x 2.  Will start clear liquids and decrease IVF rate.  2.  Bilateral pneumonia (community acquired vs aspiration?)  On Zosyn/Vanc  3.  Left hip surgery - 11/25/2013 - F. Alucio 4.  DVT prophylaxis - SQ Heparin 5.  Anemia - Hgb - 9.3 - 12/02/2013   Principal Problem:   Bowel obstruction Active Problems:   Postoperative anemia due to acute blood loss   HCAP (healthcare-associated pneumonia)   CAD (coronary artery disease)   Anemia   HLD (hyperlipidemia)   Acute respiratory failure  Subjective:  Doing well.  He got up and walked well yesterday.  He has had 2 BM's and he is ready for some liquids. Objective:   Filed Vitals:   12/02/13 0604  BP: 144/77  Pulse: 72  Temp: 98.5 F (36.9 C)  Resp: 17     Intake/Output from previous day:  12/23 0701 - 12/24 0700 In: 2515 [I.V.:1827.5; IV Piggyback:612.5] Out: 550 [Urine:550]  Intake/Output this shift:      Physical Exam:   General: WN older WM who is alert and oriented.    HEENT: Normal. Pupils equal. .   Lungs: Clear.  IS at 1,100 cc.   Abdomen: Mild distention.  BS present.  No localized tenderness   Wound: Left groin wound looks okay.  Minimal drainage from left hip wound.     Lab Results:    Recent Labs  12/01/13 0340 12/02/13 0520  WBC 6.3 6.4  HGB 9.7* 9.3*  HCT 29.8* 28.5*  PLT 213 209    BMET   Recent Labs  12/01/13 0340 12/02/13 0520  NA 142 136  K 4.5 3.8  CL 102 99  CO2 35* 29  GLUCOSE 122* 109*  BUN 39* 29*  CREATININE 1.28 1.11  CALCIUM 7.8* 7.6*    PT/INR   Recent Labs  11/30/13 0906  LABPROT 12.7  INR 0.97    ABG   Recent Labs  12/01/13 0525  PHART 7.470*  HCO3 32.5*     Studies/Results:  Dg Chest Port 1 View  12/01/2013   CLINICAL DATA:  Pneumonia.  EXAM: PORTABLE CHEST - 1 VIEW  COMPARISON:   Chest CT 11/30/2013.  Chest x-ray 11/19/2013.  FINDINGS: NG tube present with tip below the left hemidiaphragm. Mediastinum and hilar structures normal. Patchy bilateral pulmonary infiltrates are noted particularly in the left lower lobe. Left lower lobe infiltrate is prominent. No evidence of pneumothorax. Heart size and pulmonary vascularity normal. No acute osseous abnormality.  IMPRESSION: 1. Bilateral pulmonary infiltrates particularly in the left lower lobe. Left lower lobe infiltrate is prominent. Findings consistent with pneumonia. 2. NG tube noted with tip below left hemidiaphragm.   Electronically Signed   By: Maisie Fus  Register   On: 12/01/2013 07:09     Anti-infectives:   Anti-infectives   Start     Dose/Rate Route Frequency Ordered Stop   11/30/13 2200  vancomycin (VANCOCIN) 1,250 mg in sodium chloride 0.9 % 250 mL IVPB     1,250 mg 166.7 mL/hr over 90 Minutes Intravenous Every 12 hours 11/30/13 0831     11/30/13 1400  piperacillin-tazobactam (ZOSYN) IVPB 3.375 g     3.375 g 12.5 mL/hr over 240 Minutes Intravenous Every 8 hours 11/30/13 0831     11/30/13 0500  vancomycin (VANCOCIN) 1,500  mg in sodium chloride 0.9 % 500 mL IVPB     1,500 mg 250 mL/hr over 120 Minutes Intravenous  Once 11/30/13 0433 11/30/13 0856   11/30/13 0445  piperacillin-tazobactam (ZOSYN) IVPB 3.375 g     3.375 g 100 mL/hr over 30 Minutes Intravenous  Once 11/30/13 0433 11/30/13 0726     Ovidio Kin, MD, FACS Pager: [phone] [facility] Office: [phone] 12/02/2013

## 2013-12-02 NOTE — Progress Notes (Signed)
[facility] PROGRESS NOTE  Barry Morgan ZOX:[id number] DOB: 1941-07-11 DOA: 11/29/2013 PCP: Vernona Rieger, MD  Interim Summary Patient is a pleasant 72 year old gentleman with a past medical history of hypertension, dyslipidemia, presented to the emergency department on 11/30/2013 with complaints of abdominal pain associated with nausea,vomiting, and abdominal distention. He was also found to be in acute hypoxemic respiratory failure in emergent apartment requiring supplemental oxygen. A CT scan of abdomen and pelvis showed a distal obstruction at the level of the proximal sigmoid colon due to a small to moderate left inguinal hernia containing segment of inflamed proximal sigmoid colon. There was diffuse distention of the colon with fluid measuring up to 11.1 cm. A CT scan of lungs showed multifocal pneumonia prominent at the left lower lobe. He was started on broad-spectrum IV antibiotic therapy with vancomycin and Zosyn, admitted to the step and unit. General surgery and pulmonary critical care medicine were consulted. He was taken to the OR on 11/30/2013 where he underwent repair of incarcerated left inguinal hernia with insertion of mesh. He tolerated procedure well, extubated without any issues with NG tube left in place. Patient was transferred back to the step down unit in stable condition. Given clinical improvement on 12/01/2013 he was transferred out of the step down unit to telemetry. given the overall improvement with minimal NG tube output, his NG tube was discontinued on 12/01/2013. He remains n.p.o. and is on IV vancomycin and Zosyn. Plan to repeat a.m. chest x-ray to followup on pneumonia. Patient had blood cultures drawn on 11/30/2013 which are now growing gram-positive cocci in pairs and chains.                                                                                                                                                                           Assessment/Plan: 1. Incarcerated left hernia, patient taken to the OR on 11/28/2013 undergoing repair of recurrent incarcerated left inguinal hernia, with insertion of mesh. His NG tube was discontinued today ,  until bowel function returns. General surgery following. 2. Community acquire pneumonia versus aspiration pneumonia. Prior to presentation, patient reported multiple episodes of nausea and vomiting and feels that he may have choked. A CT scan of lungs performed on admission showed multifocal pneumonia. Although he seems to be improving from a respiratory standpoint, blood cultures obtained on 11/30/2013 are now growing gram-positive cocci in pairs and chains. Continue current antibiotic regimen. Repeat blood cultures as well as CXR.   3. Sepsis, evidence by positive blood cultures, acute respiratory failure, could be secondary to underlying pneumonia. continue current antibiotic regimen. 4. Acute hypoxemic respiratory failure evidence by an O2 sat of 80% on admission. Likely secondary to aspiration pneumonia versus community-acquired pneumonia. From a respiratory  standpoint he is stable will continue require 4 L supplemental oxygen via nasal cannula. Continue broad-spectrum IV antibiotic therapy 5. Paroxysmal atrial fibrillation. Heart rates remained controlled.  6. Diet: clear liquid diet.  7. DVT prophylaxis. Subcutaneous heparin  Code Status: Full Code Disposition Plan: Transferring to telemetry Discussed th plan of care with the patient and son.   Consultants:  General surgery  Pulmonary critical care medicine  Procedures:  Repair of incarcerated inguinal hernia performed on 11/30/2013  Antibiotics:  Vancomycin (started on 11/30/2013)  Zosyn (started on 11/30/2013)  HPI/Subjective: Patient states doing better today, nontoxic appearing, on clear liquid diet.   Objective: Filed Vitals:   12/02/13 1310  BP: 144/68  Pulse: 87  Temp: 98.1 F (36.7 C)  Resp: 18     Intake/Output Summary (Last 24 hours) at 12/02/13 1607 Last data filed at 12/02/13 1406  Gross per 24 hour  Intake 1680.5 ml  Output     75 ml  Net 1605.5 ml   Filed Weights   11/30/13 0437 11/30/13 0815  Weight: 99.3 kg (218 lb 14.7 oz) 105.6 kg (232 lb 12.9 oz)    Exam:   General:  Nontoxic, awake alert oriented times  Cardiovascular: Regular rate rhythm normal S1 is  Respiratory: Scattered rhonchi, crackles, on supplemental oxygen via nasal cannula  Abdomen: Status post hernia repair  Musculoskeletal: No edema   Data Reviewed: Basic Metabolic Panel:  Recent Labs Lab 11/27/13 0542 11/29/13 2325 11/30/13 0906 12/01/13 0340 12/02/13 0520  NA 134* 135 137 142 136  K 4.7 3.5 4.3 4.5 3.8  CL 101 93* 94* 102 99  CO2 25 32 30 35* 29  GLUCOSE 131* 188* 180* 122* 109*  BUN 26* 26* 33* 39* 29*  CREATININE 1.10 1.00 1.35 1.28 1.11  CALCIUM 8.1* 9.2 8.8 7.8* 7.6*  MG  --   --   --  4.6*  --   PHOS  --   --   --  5.0*  --    Liver Function Tests:  Recent Labs Lab 11/30/13 0906  AST 43*  ALT 37  ALKPHOS 61  BILITOT 0.9  PROT 6.2  ALBUMIN 2.9*   No results found for this basename: LIPASE, AMYLASE,  in the last 168 hours No results found for this basename: AMMONIA,  in the last 168 hours CBC:  Recent Labs Lab 11/28/13 0559 11/29/13 2325 11/30/13 1012 12/01/13 0340 12/02/13 0520  WBC 7.1 9.9 5.7 6.3 6.4  NEUTROABS  --  8.6* 5.0  --   --   HGB 7.3* 11.2* 11.9* 9.7* 9.3*  HCT 22.2* 32.9* 36.4* 29.8* 28.5*  MCV 92.9 90.9 91.9 93.1 94.4  PLT 159 227 214 213 209   Cardiac Enzymes: No results found for this basename: CKTOTAL, CKMB, CKMBINDEX, TROPONINI,  in the last 168 hours BNP (last 3 results) No results found for this basename: PROBNP,  in the last 8760 hours CBG:  Recent Labs Lab 11/30/13 1617 11/30/13 2015 11/30/13 2349 12/01/13 0345 12/01/13 0927  GLUCAP 136* 124* 119* 105* 117*    Recent Results (from the past 240 hour(s))   CULTURE, BLOOD (ROUTINE X 2)     Status: None   Collection Time    11/30/13  4:53 AM      Result Value Range Status   Specimen Description BLOOD RIGHT HAND   Final   Special Requests BOTTLES DRAWN AEROBIC ONLY 3CC   Final   Culture  Setup Time     Final  Value: 11/30/2013 09:21     Performed at [facility]   Culture     Final   Value: ENTEROCOCCUS SPECIES     Note: Gram Stain Report Called to,Read Back By and Verified With: Max Sane ON 12/01/2013 AT 12:27A BY WILEJ     Performed at [facility]   Report Status PENDING   Incomplete  CULTURE, BLOOD (ROUTINE X 2)     Status: None   Collection Time    11/30/13  5:13 AM      Result Value Range Status   Specimen Description BLOOD LEFT HAND   Final   Special Requests BOTTLES DRAWN AEROBIC AND ANAEROBIC 3CC   Final   Culture  Setup Time     Final   Value: 11/30/2013 09:20     Performed at [facility]   Culture     Final   Value: ENTEROCOCCUS SPECIES     Note: Gram Stain Report Called to,Read Back By and Verified With: Max Sane ON 12/01/2013 AT 12:27A BY Serafina Mitchell     Performed at [facility]   Report Status PENDING   Incomplete     Studies: Dg Chest Port 1 View  12/02/2013   CLINICAL DATA:  Pneumonia  EXAM: PORTABLE CHEST - 1 VIEW  COMPARISON:  Yesterday  FINDINGS: Stable right basilar subsegmental atelectasis. Improved left basilar patchy airspace disease. No pneumothorax. Normal heart size. NG tube removed.  IMPRESSION: Improved left basilar patchy consolidation.   Electronically Signed   By: Maryclare Bean M.D.   On: 12/02/2013 07:49   Dg Chest Port 1 View  12/01/2013   CLINICAL DATA:  Pneumonia.  EXAM: PORTABLE CHEST - 1 VIEW  COMPARISON:  Chest CT 11/30/2013.  Chest x-ray 11/19/2013.  FINDINGS: NG tube present with tip below the left hemidiaphragm. Mediastinum and hilar structures normal. Patchy bilateral pulmonary infiltrates are noted particularly in the left lower lobe. Left lower lobe  infiltrate is prominent. No evidence of pneumothorax. Heart size and pulmonary vascularity normal. No acute osseous abnormality.  IMPRESSION: 1. Bilateral pulmonary infiltrates particularly in the left lower lobe. Left lower lobe infiltrate is prominent. Findings consistent with pneumonia. 2. NG tube noted with tip below left hemidiaphragm.   Electronically Signed   By: Maisie Fus  Register   On: 12/01/2013 07:09    Scheduled Meds: . heparin subcutaneous  5,000 Units Subcutaneous Q8H  . levalbuterol  0.63 mg Nebulization Q6H WA  . pantoprazole (PROTONIX) IV  40 mg Intravenous Q24H  . piperacillin-tazobactam (ZOSYN)  IV  3.375 g Intravenous Q8H  . sodium chloride  3 mL Intravenous Q12H  . vancomycin  1,250 mg Intravenous Q12H   Continuous Infusions: . dextrose 5 % and 0.45 % NaCl with KCl 20 mEq/L 50 mL/hr at 12/02/13 1411    Principal Problem:   Enterococcal bacteremia Active Problems:   Postoperative anemia due to acute blood loss   HCAP (healthcare-associated pneumonia)   CAD (coronary artery disease)   HLD (hyperlipidemia)   Acute respiratory failure   Incarcerated inguinal hernia   History of total hip arthroplasty   Hyperglycemia   GERD (gastroesophageal reflux disease)   Ex-cigarette smoker    Time spent: 35 minutes    Raistlin Gum  [facility] Pager [phone]. If 7PM-7AM, please contact night-coverage at [email or web address], password [facility] 12/02/2013, 4:07 PM  LOS: 3 days

## 2013-12-02 NOTE — Progress Notes (Signed)
ANTIBIOTIC CONSULT NOTE - FOLLOW UP  Pharmacy Consult for vancomycin, Zosyn Indication: pneumonia, enterococcal bacteremia  No Known Allergies  Patient Measurements: Height: 5' 10.8" (179.8 cm) Weight: 232 lb 12.9 oz (105.6 kg) IBW/kg (Calculated) : 74.84   Vital Signs: Temp: 98.5 F (36.9 C) (12/24 0604) Temp src: Oral (12/24 0604) BP: 144/77 mmHg (12/24 0604) Pulse Rate: 72 (12/24 0604) Intake/Output from previous day: 12/23 0701 - 12/24 0700 In: 2515 [I.V.:1827.5; IV Piggyback:612.5] Out: 550 [Urine:550] Intake/Output from this shift:    Labs:  Recent Labs  11/30/13 0906 11/30/13 1012 12/01/13 0340 12/02/13 0520  WBC  --  5.7 6.3 6.4  HGB  --  11.9* 9.7* 9.3*  PLT  --  214 213 209  CREATININE 1.35  --  1.28 1.11   Estimated Creatinine Clearance: 74.1 ml/min (by C-G formula based on Cr of 1.11).  Recent Labs  12/02/13 0850  VANCOTROUGH 12.6     Microbiology: Recent Results (from the past 720 hour(s))  SURGICAL PCR SCREEN     Status: None   Collection Time    11/19/13 10:28 AM      Result Value Range Status   MRSA, PCR NEGATIVE  NEGATIVE Final   Staphylococcus aureus NEGATIVE  NEGATIVE Final   Comment:            The Xpert SA Assay (FDA     approved for NASAL specimens     in patients over 44 years of age),     is one component of     a comprehensive surveillance     program.  Test performance has     been validated by [employer] for patients greater     than or equal to 50 year old.     It is not intended     to diagnose infection nor to     guide or monitor treatment.  CULTURE, BLOOD (ROUTINE X 2)     Status: None   Collection Time    11/30/13  4:53 AM      Result Value Range Status   Specimen Description BLOOD RIGHT HAND   Final   Special Requests BOTTLES DRAWN AEROBIC ONLY 3CC   Final   Culture  Setup Time     Final   Value: 11/30/2013 09:21     Performed at [facility]   Culture     Final   Value: ENTEROCOCCUS SPECIES      Note: Gram Stain Report Called to,Read Back By and Verified With: Max Sane ON 12/01/2013 AT 12:27A BY WILEJ     Performed at [facility]   Report Status PENDING   Incomplete  CULTURE, BLOOD (ROUTINE X 2)     Status: None   Collection Time    11/30/13  5:13 AM      Result Value Range Status   Specimen Description BLOOD LEFT HAND   Final   Special Requests BOTTLES DRAWN AEROBIC AND ANAEROBIC 3CC   Final   Culture  Setup Time     Final   Value: 11/30/2013 09:20     Performed at [facility]   Culture     Final   Value: ENTEROCOCCUS SPECIES     Note: Gram Stain Report Called to,Read Back By and Verified With: Max Sane ON 12/01/2013 AT 12:27A BY Serafina Mitchell     Performed at [facility]   Report Status PENDING   Incomplete    Anti-infectives  Start     Dose/Rate Route Frequency Ordered Stop   11/30/13 2200  vancomycin (VANCOCIN) 1,250 mg in sodium chloride 0.9 % 250 mL IVPB     1,250 mg 166.7 mL/hr over 90 Minutes Intravenous Every 12 hours 11/30/13 0831     11/30/13 1400  piperacillin-tazobactam (ZOSYN) IVPB 3.375 g     3.375 g 12.5 mL/hr over 240 Minutes Intravenous Every 8 hours 11/30/13 0831     11/30/13 0500  vancomycin (VANCOCIN) 1,500 mg in sodium chloride 0.9 % 500 mL IVPB     1,500 mg 250 mL/hr over 120 Minutes Intravenous  Once 11/30/13 0433 11/30/13 0856   11/30/13 0445  piperacillin-tazobactam (ZOSYN) IVPB 3.375 g     3.375 g 100 mL/hr over 30 Minutes Intravenous  Once 11/30/13 1610 11/30/13 0726      Assessment: 72 y/o M s/p L THA 12/17, readmitted with sigmoid colon obstruction secondary to an incarcerated inguinal hernia, which was repaired on 12/22.  CXR showed pneumonia (?aspiration vs HCAP), and empiric Zosyn + vancomycin was started.  Now on D#3 Zosyn 3.375 grams IV q8h (extended-infusion) + vancomycin 1250 mg IV q12h  2 of 2 blood cultures from 12/22 growing Enterococcus.  Vancomycin trough this AM (drawn after third  maintenance dose) is 12.6.   Although slightly below the goal range, level is likely to rise with further drug accumulation.  Goal of Therapy:  Eradication of infection Adjust Zosyn and vancomycin dosages for renal function and weight Vancomycin trough 15-20  Plan:  1.  Continue current vancomycin dosage (1250 mg IV q12h). 2.  Continue current Zosyn dosage (3.375 grams IV q8h by extended infusion). 3.  Follow serum creatinine.   4.  Recheck vancomycin trough in 3 to 5 days. 5.  Recommend ID consult as per [facility] guidelines.  Note:  Suggest considering changing SQ heparin to Lovenox 40mg  SQ q24h since patient is also s/p THA on 12/17.  Elie Goody, PharmD, BCPS Pager: [phone] 12/02/2013  10:35 AM

## 2013-12-02 NOTE — Consult Note (Signed)
[facility]    Date of Admission:  11/29/2013           Day 2 vancomycin        Day 2 piperacillin tazobactam       Reason for Consult: [place] [facility] ([facility]) consult for enterococcal bacteremia    Primary Care Physician: Dr. Vernona Rieger  Principal Problem:   Enterococcal bacteremia Active Problems:   HCAP (healthcare-associated pneumonia)   Incarcerated inguinal hernia   History of total hip arthroplasty   Postoperative anemia due to acute blood loss   CAD (coronary artery disease)   HLD (hyperlipidemia)   Acute respiratory failure   Hyperglycemia   GERD (gastroesophageal reflux disease)   Ex-cigarette smoker   . heparin subcutaneous  5,000 Units Subcutaneous Q8H  . levalbuterol  0.63 mg Nebulization Q6H WA  . pantoprazole (PROTONIX) IV  40 mg Intravenous Q24H  . piperacillin-tazobactam (ZOSYN)  IV  3.375 g Intravenous Q8H  . sodium chloride  3 mL Intravenous Q12H  . vancomycin  1,250 mg Intravenous Q12H    Recommendations: 1. Continue current antibiotics 2. Repeat blood cultures   Assessment: Barry Morgan has enterococcal bacteremia, most likely as a result of his acutely incarcerated left inguinal hernia that has now been reduced and repaired. He had no evidence of abscess or dead bowel at the time of surgery. I suspect that this is a more likely cause than pneumonia since enterococcus is an unusual cause for HCAP. A CT scan showed a dilated esophagus with puts him at risk for aspiration. He has only very mild symptoms of pneumonia but I would continue vancomycin and piperacillin tazobactam for now pending further observation and final culture results. It appears that his bacteremia was caught very early and treated appropriately which decreases his risk of complications such as prosthetic hip infection and endocarditis. I will followup tomorrow.   HPI: Barry Morgan is a 72 y.o. male who was  hospitalized and underwent a left total arthroplasty on December 17. He had an uneventful immediate postoperative course and was discharged to [facility] place on December 21. Just after discharge he began to develop abdominal bloating and pain associated with constipation and was rehospitalized. CT scans revealed an incarcerated left inguinal hernia and bilateral multifocal pulmonary infiltrates. He did not have any fever, chills or sweats. Blood cultures were obtained and he was started on broad empiric antibiotic therapy. He underwent reduction of his inguinal hernia with mesh repair 2 days ago and is feeling better. Both admission blood cultures are growing enterococcus.   Review of Systems: Constitutional: negative Eyes: negative Ears, nose, mouth, throat, and face: negative Respiratory: positive for cough and sputum, negative for dyspnea on exertion, hemoptysis, pleurisy/chest pain and wheezing Cardiovascular: negative Gastrointestinal: positive for abdominal pain, change in bowel habits and constipation, negative for diarrhea, nausea and vomiting Genitourinary:negative Musculoskeletal:markedly improved left hip pain since his surgery  Past Medical History  Diagnosis Date  . Hyperlipidemia   . Eye globe prosthesis     RIGHT  . GERD (gastroesophageal reflux disease)   . Hypertension   . Dysrhythmia   . BPH (benign prostatic hypertrophy)   . PONV (postoperative nausea and vomiting)     SICK FOR 3 DAYS AFTER COLONOSCOPY  . Coronary artery disease     DRUG ELUTING STENT DISTAL RCA 04/2012  . Shortness of breath     ONLY IF CLIMBING 2 OR 3 FLIGHTS OF  STAIRS  . Arthritis     PAIN AND OA LEFT HIP    History  Substance Use Topics  . Smoking status: Former Smoker    Types: Cigars  . Smokeless tobacco: Never Used  . Alcohol Use: Yes     Comment: QUIT SMOKING 15 YRS AGO   COUPLE OF GLASSES WINE DURING WEEK    History reviewed. No pertinent family history. No Known  Allergies  OBJECTIVE: Blood pressure 144/68, pulse 87, temperature 98.1 F (36.7 C), temperature source Oral, resp. rate 18, height 5' 10.8" (1.798 m), weight 105.6 kg (232 lb 12.9 oz), SpO2 90.00%. General: He is comfortable and in no distress Skin: No rash, splinter or conjunctival hemorrhages Lungs: Diffuse crackles posteriorly Cor: Distant but regular S1 and S2 with no murmur Abdomen: Distended and tympanitic with decreased bowel sounds. It is no tenderness with palpation. His left inguinal incision looks good Joints and extremities: He has some soft tissue edema around his left hip incision and a small amount of serosanguineous drainage on his gauze dressing. There is no erythema or other signs of infection  Lab Results Lab Results  Component Value Date   WBC 6.4 12/02/2013   HGB 9.3* 12/02/2013   HCT 28.5* 12/02/2013   MCV 94.4 12/02/2013   PLT 209 12/02/2013    Lab Results  Component Value Date   CREATININE 1.11 12/02/2013   BUN 29* 12/02/2013   NA 136 12/02/2013   K 3.8 12/02/2013   CL 99 12/02/2013   CO2 29 12/02/2013    Lab Results  Component Value Date   ALT 37 11/30/2013   AST 43* 11/30/2013   ALKPHOS 61 11/30/2013   BILITOT 0.9 11/30/2013     Microbiology: Recent Results (from the past 240 hour(s))  CULTURE, BLOOD (ROUTINE X 2)     Status: None   Collection Time    11/30/13  4:53 AM      Result Value Range Status   Specimen Description BLOOD RIGHT HAND   Final   Special Requests BOTTLES DRAWN AEROBIC ONLY 3CC   Final   Culture  Setup Time     Final   Value: 11/30/2013 09:21     Performed at [facility]   Culture     Final   Value: ENTEROCOCCUS SPECIES     Note: Gram Stain Report Called to,Read Back By and Verified With: Max Sane ON 12/01/2013 AT 12:27A BY WILEJ     Performed at [facility]   Report Status PENDING   Incomplete  CULTURE, BLOOD (ROUTINE X 2)     Status: None   Collection Time    11/30/13  5:13 AM      Result  Value Range Status   Specimen Description BLOOD LEFT HAND   Final   Special Requests BOTTLES DRAWN AEROBIC AND ANAEROBIC 3CC   Final   Culture  Setup Time     Final   Value: 11/30/2013 09:20     Performed at [facility]   Culture     Final   Value: ENTEROCOCCUS SPECIES     Note: Gram Stain Report Called to,Read Back By and Verified With: Max Sane ON 12/01/2013 AT 12:27A BY Serafina Mitchell     Performed at [facility]   Report Status PENDING   Incomplete    Cliffton Asters, MD [facility] [phone] pager   [phone] cell 12/02/2013, 1:32 PM

## 2013-12-02 NOTE — Progress Notes (Signed)
Pt ambulated down entire [address] hallway. Some mild shortness of breath observed. O2 Saturation was generally about 88%-90% but at one point at the end of the hall got down to 83%, quickly going back into upper 80's. Pt very eager to have BM.  Incentive spirometry and flutter valve encouraged.

## 2013-12-02 NOTE — Progress Notes (Addendum)
Physical Therapy Treatment Patient Details Name: Barry Morgan MRN: [id number] DOB: [birth year] Today's Date: 12/02/2013 Time: 1130-1150 PT Time Calculation (min): 20 min  PT Assessment / Plan / Recommendation  History of Present Illness SBO, PNA, recent THA   PT Comments   **Pt tolerated decreased O2 flow rate with ambulation today. Yesterday he walked with 6L O2, today with 4L with sats 90-92%. *  Follow Up Recommendations  SNF     Does the patient have the potential to tolerate intense rehabilitation     Barriers to Discharge        Equipment Recommendations  None recommended by PT    Recommendations for Other Services    Frequency Min 3X/week   Progress towards PT Goals Progress towards PT goals: Progressing toward goals  Plan      Precautions / Restrictions Precautions Precautions: None Restrictions Weight Bearing Restrictions: No   Pertinent Vitals/Pain **SaO2 87% on RA at rest SaO2 90-92% on 4L O2 walking HR 88 with walking Pt denied pain*    Mobility  Bed Mobility Bed Mobility: Supine to Sit Supine to Sit: 6: Modified independent (Device/Increase time);HOB elevated;With rails Sitting - Scoot to Edge of Bed: 6: Modified independent (Device/Increase time);With rail Transfers Transfers: Sit to Stand;Stand to Sit Sit to Stand: From bed;7: Independent Stand to Sit: To chair/3-in-1;7: Independent Ambulation/Gait Ambulation/Gait Assistance: 5: Supervision Ambulation Distance (Feet): 300 Feet Assistive device: None;Other (Comment) (pushing IV pole) Gait Pattern: Within Functional Limits General Gait Details: Decreased O2 to 4L today for walking. SaO2 90-92% on 4L. 87% at rest on RA. VCs to inhale through nose while walking as SaO2 would drop to 84% when he forgot to do this.  Stairs: No    Exercises     PT Diagnosis:    PT Problem List:   PT Treatment Interventions:     PT Goals (current goals can now be found in the care plan section) Acute Rehab PT  Goals Patient Stated Goal: Rehab and home to resume previous lifestyle with decreased pain PT Goal Formulation: With patient Time For Goal Achievement: 12/03/13 Potential to Achieve Goals: Good  Visit Information  Last PT Received On: 12/02/13 Assistance Needed: +1 History of Present Illness: SBO, PNA, recent THA    Subjective Data  Patient Stated Goal: Rehab and home to resume previous lifestyle with decreased pain   Cognition  Cognition Arousal/Alertness: Awake/alert Behavior During Therapy: WFL for tasks assessed/performed Overall Cognitive Status: Within Functional Limits for tasks assessed    Balance     End of Session PT - End of Session Equipment Utilized During Treatment: Gait belt;Oxygen Activity Tolerance: Patient tolerated treatment well Patient left: in chair;with call bell/phone within reach Nurse Communication: Mobility status   GP     Ralene Bathe Kistler 12/02/2013, 12:06 PM [phone]

## 2013-12-02 NOTE — Progress Notes (Signed)
I noticed early this morning that this patient has a cardiac monitoring order.  Upon waking patient to place on telemetry patient refuses to be put on heart monitor.  Patient states that he had atrial fibrillation many years ago and he does not need to be on a heart monitor. Patient states that being placed on a heart monitor is not his main concern and that his main concern is to have a bowel movement.

## 2013-12-03 DIAGNOSIS — K219 Gastro-esophageal reflux disease without esophagitis: Secondary | ICD-10-CM

## 2013-12-03 LAB — CULTURE, BLOOD (ROUTINE X 2)

## 2013-12-03 MED ORDER — MUSCLE RUB 10-15 % EX CREA
TOPICAL_CREAM | CUTANEOUS | Status: DC | PRN
  Filled 2013-12-03: qty 85

## 2013-12-03 NOTE — Progress Notes (Signed)
Patient ID: Barry Morgan, male   DOB: 18-May-1941, 72 y.o.   MRN: [id number] [facility] Surgery Progress Note:   3 Days Post-Op  Subjective: Mental status is clear.  Appears to be feeling much better Objective: Vital signs in last 24 hours: Temp:  [98 F (36.7 C)-98.2 F (36.8 C)] 98 F (36.7 C) (12/25 0550) Pulse Rate:  [72-87] 72 (12/25 0550) Resp:  [18] 18 (12/25 0550) BP: (128-144)/(62-68) 128/67 mmHg (12/25 0550) SpO2:  [87 %-97 %] 97 % (12/25 1045)  Intake/Output from previous day: 12/24 0701 - 12/25 0700 In: 2265.1 [P.O.:360; I.V.:1305.1; IV Piggyback:600] Out: -  Intake/Output this shift: Total I/O In: 840 [P.O.:840] Out: -   Physical Exam: Work of breathing is no labored.  Occasional report of sharp shooting pains but not bad.  Passing flatus and no nausea with clear liquids.   Lab Results:  Results for orders placed during the hospital encounter of 11/29/13 (from the past 48 hour(s))  CULTURE, BLOOD (ROUTINE X 2)     Status: None   Collection Time    12/01/13  7:15 PM      Result Value Range   Specimen Description BLOOD RIGHT ARM     Special Requests BOTTLES DRAWN AEROBIC ONLY 6CC     Culture  Setup Time       Value: 12/02/2013 00:52     Performed at [facility]   Culture       Value:        BLOOD CULTURE RECEIVED NO GROWTH TO DATE CULTURE WILL BE HELD FOR 5 DAYS BEFORE ISSUING A FINAL NEGATIVE REPORT     Performed at [facility]   Report Status PENDING    CULTURE, BLOOD (ROUTINE X 2)     Status: None   Collection Time    12/01/13  7:35 PM      Result Value Range   Specimen Description BLOOD RIGHT HAND     Special Requests BOTTLES DRAWN AEROBIC ONLY 10CC     Culture  Setup Time       Value: 12/02/2013 00:52     Performed at [facility]   Culture       Value:        BLOOD CULTURE RECEIVED NO GROWTH TO DATE CULTURE WILL BE HELD FOR 5 DAYS BEFORE ISSUING A FINAL NEGATIVE REPORT     Performed at [facility]   Report  Status PENDING    CBC     Status: Abnormal   Collection Time    12/02/13  5:20 AM      Result Value Range   WBC 6.4  4.0 - 10.5 K/uL   RBC 3.02 (*) 4.22 - 5.81 MIL/uL   Hemoglobin 9.3 (*) 13.0 - 17.0 g/dL   HCT 40.9 (*) 81.1 - 91.4 %   MCV 94.4  78.0 - 100.0 fL   MCH 30.8  26.0 - 34.0 pg   MCHC 32.6  30.0 - 36.0 g/dL   RDW 78.2  95.6 - 21.3 %   Platelets 209  150 - 400 K/uL  BASIC METABOLIC PANEL     Status: Abnormal   Collection Time    12/02/13  5:20 AM      Result Value Range   Sodium 136  135 - 145 mEq/L   Potassium 3.8  3.5 - 5.1 mEq/L   Chloride 99  96 - 112 mEq/L   CO2 29  19 - 32 mEq/L   Glucose, Bld  109 (*) 70 - 99 mg/dL   BUN 29 (*) 6 - 23 mg/dL   Creatinine, Ser 1.61  0.50 - 1.35 mg/dL   Calcium 7.6 (*) 8.4 - 10.5 mg/dL   GFR calc non Af Amer 64 (*) >90 mL/min   GFR calc Af Amer 75 (*) >90 mL/min   Comment: (NOTE)     The eGFR has been calculated using the CKD EPI equation.     This calculation has not been validated in all clinical situations.     eGFR's persistently <90 mL/min signify possible Chronic Kidney     Disease.  VANCOMYCIN, TROUGH     Status: None   Collection Time    12/02/13  8:50 AM      Result Value Range   Vancomycin Tr 12.6  10.0 - 20.0 ug/mL  CULTURE, BLOOD (ROUTINE X 2)     Status: None   Collection Time    12/02/13  2:19 PM      Result Value Range   Specimen Description BLOOD RIGHT ARM     Special Requests BOTTLES DRAWN AEROBIC AND ANAEROBIC 5CC     Culture  Setup Time       Value: 12/02/2013 20:53     Performed at [facility]   Culture       Value:        BLOOD CULTURE RECEIVED NO GROWTH TO DATE CULTURE WILL BE HELD FOR 5 DAYS BEFORE ISSUING A FINAL NEGATIVE REPORT     Performed at [facility]   Report Status PENDING    CULTURE, BLOOD (ROUTINE X 2)     Status: None   Collection Time    12/02/13  2:29 PM      Result Value Range   Specimen Description BLOOD LEFT HAND     Special Requests BOTTLES DRAWN AEROBIC  ONLY 1CC     Culture  Setup Time       Value: 12/02/2013 20:55     Performed at [facility]   Culture       Value:        BLOOD CULTURE RECEIVED NO GROWTH TO DATE CULTURE WILL BE HELD FOR 5 DAYS BEFORE ISSUING A FINAL NEGATIVE REPORT     Performed at [facility]   Report Status PENDING      Radiology/Results: Dg Chest Port 1 View  12/02/2013   CLINICAL DATA:  Pneumonia  EXAM: PORTABLE CHEST - 1 VIEW  COMPARISON:  Yesterday  FINDINGS: Stable right basilar subsegmental atelectasis. Improved left basilar patchy airspace disease. No pneumothorax. Normal heart size. NG tube removed.  IMPRESSION: Improved left basilar patchy consolidation.   Electronically Signed   By: Maryclare Bean M.D.   On: 12/02/2013 07:49    Anti-infectives: Anti-infectives   Start     Dose/Rate Route Frequency Ordered Stop   11/30/13 2200  vancomycin (VANCOCIN) 1,250 mg in sodium chloride 0.9 % 250 mL IVPB     1,250 mg 166.7 mL/hr over 90 Minutes Intravenous Every 12 hours 11/30/13 0831     11/30/13 1400  piperacillin-tazobactam (ZOSYN) IVPB 3.375 g     3.375 g 12.5 mL/hr over 240 Minutes Intravenous Every 8 hours 11/30/13 0831     11/30/13 0500  vancomycin (VANCOCIN) 1,500 mg in sodium chloride 0.9 % 500 mL IVPB     1,500 mg 250 mL/hr over 120 Minutes Intravenous  Once 11/30/13 0433 11/30/13 0856   11/30/13 0445  piperacillin-tazobactam (ZOSYN) IVPB 3.375 g  3.375 g 100 mL/hr over 30 Minutes Intravenous  Once 11/30/13 1610 11/30/13 0726      Assessment/Plan: Problem List: Patient Active Problem List   Diagnosis Date Noted  . Incarcerated inguinal hernia 12/02/2013  . Enterococcal bacteremia 12/02/2013  . History of total hip arthroplasty 12/02/2013  . Hyperglycemia 12/02/2013  . GERD (gastroesophageal reflux disease) 12/02/2013  . Ex-cigarette smoker 12/02/2013  . HCAP (healthcare-associated pneumonia) 11/30/2013  . CAD (coronary artery disease) 11/30/2013  . HLD (hyperlipidemia)  11/30/2013  . Acute respiratory failure 11/30/2013  . Hyponatremia 11/26/2013  . Postoperative anemia due to acute blood loss 11/26/2013  . OA (osteoarthritis) of hip 11/25/2013    Advance to full liquid diet.  3 Days Post-Op    LOS: 4 days   Matt B. Daphine Deutscher, MD, [facility] [phone] beeper [phone]  12/03/2013 10:54 AM

## 2013-12-03 NOTE — Progress Notes (Signed)
Patient ID: Barry Morgan, male   DOB: 09-24-1941, 72 y.o.   MRN: [id number]         [facility]    Date of Admission:  11/29/2013   Total days of antibiotics 3         Principal Problem:   Enterococcal bacteremia Active Problems:   HCAP (healthcare-associated pneumonia)   Incarcerated inguinal hernia   History of total hip arthroplasty   Postoperative anemia due to acute blood loss   CAD (coronary artery disease)   HLD (hyperlipidemia)   Acute respiratory failure   Hyperglycemia   GERD (gastroesophageal reflux disease)   Ex-cigarette smoker   . heparin subcutaneous  5,000 Units Subcutaneous Q8H  . levalbuterol  0.63 mg Nebulization Q6H WA  . pantoprazole (PROTONIX) IV  40 mg Intravenous Q24H  . piperacillin-tazobactam (ZOSYN)  IV  3.375 g Intravenous Q8H  . sodium chloride  3 mL Intravenous Q12H  . vancomycin  1,250 mg Intravenous Q12H    Subjective: He had a large bowel movement this morning and is feeling much better. He is complaining of some stiffness in his left hip but no pain. He is eager to advance his diet and take a shower.  Review of Systems: Pertinent items are noted in HPI.  Past Medical History  Diagnosis Date  . Hyperlipidemia   . Eye globe prosthesis     RIGHT  . GERD (gastroesophageal reflux disease)   . Hypertension   . Dysrhythmia   . BPH (benign prostatic hypertrophy)   . PONV (postoperative nausea and vomiting)     SICK FOR 3 DAYS AFTER COLONOSCOPY  . Coronary artery disease     DRUG ELUTING STENT DISTAL RCA 04/2012  . Shortness of breath     ONLY IF CLIMBING 2 OR 3 FLIGHTS OF STAIRS  . Arthritis     PAIN AND OA LEFT HIP    History  Substance Use Topics  . Smoking status: Former Smoker    Types: Cigars  . Smokeless tobacco: Never Used  . Alcohol Use: Yes     Comment: QUIT SMOKING 15 YRS AGO   COUPLE OF GLASSES WINE DURING WEEK    History reviewed. No pertinent family history.  No Known  Allergies  Objective: Temp:  [98 F (36.7 C)-98.2 F (36.8 C)] 98 F (36.7 C) (12/25 0550) Pulse Rate:  [72-87] 72 (12/25 0550) Resp:  [18] 18 (12/25 0550) BP: (128-144)/(62-68) 128/67 mmHg (12/25 0550) SpO2:  [87 %-97 %] 94 % (12/25 0550)  General: He is in good spirits sitting up in a chair eating breakfast Skin: No rash, scleroderma conjunctival hemorrhages Lungs: Scattered bilateral crackles and a few faint expiratory wheezes Cor: Distant regular S1 and S2 with no murmur Abdomen: Less distended and nontender. Left inguinal incision healing nicely. Serous drainage from his left hip incision   Lab Results Lab Results  Component Value Date   WBC 6.4 12/02/2013   HGB 9.3* 12/02/2013   HCT 28.5* 12/02/2013   MCV 94.4 12/02/2013   PLT 209 12/02/2013    Lab Results  Component Value Date   CREATININE 1.11 12/02/2013   BUN 29* 12/02/2013   NA 136 12/02/2013   K 3.8 12/02/2013   CL 99 12/02/2013   CO2 29 12/02/2013    Lab Results  Component Value Date   ALT 37 11/30/2013   AST 43* 11/30/2013   ALKPHOS 61 11/30/2013   BILITOT 0.9 11/30/2013      Microbiology: Recent Results (  from the past 240 hour(s))  CULTURE, BLOOD (ROUTINE X 2)     Status: None   Collection Time    11/30/13  4:53 AM      Result Value Range Status   Specimen Description BLOOD RIGHT HAND   Final   Special Requests BOTTLES DRAWN AEROBIC ONLY 3CC   Final   Culture  Setup Time     Final   Value: 11/30/2013 09:21     Performed at [facility]   Culture     Final   Value: ENTEROCOCCUS SPECIES     Note: COMBINATION THERAPY OF HIGH DOSE AMPICILLIN OR VANCOMYCIN, PLUS AN AMINOGLYCOSIDE, IS USUALLY INDICATED FOR SERIOUS ENTEROCOCCAL INFECTIONS.     Note: Gram Stain Report Called to,Read Back By and Verified With: Max Sane ON 12/01/2013 AT 12:27A BY Serafina Mitchell     Performed at [facility]   Report Status 12/03/2013 FINAL   Final   Organism ID, Bacteria ENTEROCOCCUS SPECIES   Final   CULTURE, BLOOD (ROUTINE X 2)     Status: None   Collection Time    11/30/13  5:13 AM      Result Value Range Status   Specimen Description BLOOD LEFT HAND   Final   Special Requests BOTTLES DRAWN AEROBIC AND ANAEROBIC 3CC   Final   Culture  Setup Time     Final   Value: 11/30/2013 09:20     Performed at [facility]   Culture     Final   Value: ENTEROCOCCUS SPECIES     Note: SUSCEPTIBILITIES PERFORMED ON PREVIOUS CULTURE WITHIN THE LAST 5 DAYS.     Note: Gram Stain Report Called to,Read Back By and Verified With: Max Sane ON 12/01/2013 AT 12:27A BY Serafina Mitchell     Performed at [facility]   Report Status 12/03/2013 FINAL   Final  CULTURE, BLOOD (ROUTINE X 2)     Status: None   Collection Time    12/01/13  7:15 PM      Result Value Range Status   Specimen Description BLOOD RIGHT ARM   Final   Special Requests BOTTLES DRAWN AEROBIC ONLY [facility]   Final   Culture  Setup Time     Final   Value: 12/02/2013 00:52     Performed at [facility]   Culture     Final   Value:        BLOOD CULTURE RECEIVED NO GROWTH TO DATE CULTURE WILL BE HELD FOR 5 DAYS BEFORE ISSUING A FINAL NEGATIVE REPORT     Performed at [facility]   Report Status PENDING   Incomplete  CULTURE, BLOOD (ROUTINE X 2)     Status: None   Collection Time    12/01/13  7:35 PM      Result Value Range Status   Specimen Description BLOOD RIGHT HAND   Final   Special Requests BOTTLES DRAWN AEROBIC ONLY 10CC   Final   Culture  Setup Time     Final   Value: 12/02/2013 00:52     Performed at [facility]   Culture     Final   Value:        BLOOD CULTURE RECEIVED NO GROWTH TO DATE CULTURE WILL BE HELD FOR 5 DAYS BEFORE ISSUING A FINAL NEGATIVE REPORT     Performed at [facility]   Report Status PENDING   Incomplete  CULTURE, BLOOD (ROUTINE X 2)     Status:  None   Collection Time    12/02/13  2:19 PM      Result Value Range Status   Specimen Description BLOOD RIGHT ARM    Final   Special Requests BOTTLES DRAWN AEROBIC AND ANAEROBIC 5CC   Final   Culture  Setup Time     Final   Value: 12/02/2013 20:53     Performed at [facility]   Culture     Final   Value:        BLOOD CULTURE RECEIVED NO GROWTH TO DATE CULTURE WILL BE HELD FOR 5 DAYS BEFORE ISSUING A FINAL NEGATIVE REPORT     Performed at [facility]   Report Status PENDING   Incomplete  CULTURE, BLOOD (ROUTINE X 2)     Status: None   Collection Time    12/02/13  2:29 PM      Result Value Range Status   Specimen Description BLOOD LEFT HAND   Final   Special Requests BOTTLES DRAWN AEROBIC ONLY 1CC   Final   Culture  Setup Time     Final   Value: 12/02/2013 20:55     Performed at [facility]   Culture     Final   Value:        BLOOD CULTURE RECEIVED NO GROWTH TO DATE CULTURE WILL BE HELD FOR 5 DAYS BEFORE ISSUING A FINAL NEGATIVE REPORT     Performed at [facility]   Report Status PENDING   Incomplete    Studies/Results: Dg Chest Port 1 View  12/02/2013   CLINICAL DATA:  Pneumonia  EXAM: PORTABLE CHEST - 1 VIEW  COMPARISON:  Yesterday  FINDINGS: Stable right basilar subsegmental atelectasis. Improved left basilar patchy airspace disease. No pneumothorax. Normal heart size. NG tube removed.  IMPRESSION: Improved left basilar patchy consolidation.   Electronically Signed   By: Maryclare Bean M.D.   On: 12/02/2013 07:49    Assessment: He is improving on therapy for HCAP and transient enterococcal bacteremia related to his incarcerated left inguinal hernia. Repeat blood cultures are negative so far. Blood cultures remained negative her tomorrow I will place a PICC and plan on 2 weeks of IV antibiotic therapy for his bacteremia.  Plan: 1. Continue current antibiotics 2. PICC placement tomorrow if blood cultures remain negative  Cliffton Asters, MD [facility] [phone] pager   [phone] cell 12/03/2013, 10:10 AM

## 2013-12-03 NOTE — Progress Notes (Signed)
[facility] PROGRESS NOTE  Dwain Huhn ZOX:[id number] DOB: [birth year] DOA: 11/29/2013 PCP: Vernona Rieger, MD  Interim Summary Patient is a pleasant 72 year old gentleman with a past medical history of hypertension, dyslipidemia, presented to the emergency department on 11/30/2013 with complaints of abdominal pain associated with nausea,vomiting, and abdominal distention. He was also found to be in acute hypoxemic respiratory failure in emergent apartment requiring supplemental oxygen. A CT scan of abdomen and pelvis showed a distal obstruction at the level of the proximal sigmoid colon due to a small to moderate left inguinal hernia containing segment of inflamed proximal sigmoid colon. There was diffuse distention of the colon with fluid measuring up to 11.1 cm. A CT scan of lungs showed multifocal pneumonia prominent at the left lower lobe. He was started on broad-spectrum IV antibiotic therapy with vancomycin and Zosyn, admitted to the step and unit. General surgery and pulmonary critical care medicine were consulted. He was taken to the OR on 11/30/2013 where he underwent repair of incarcerated left inguinal hernia with insertion of mesh. He tolerated procedure well, extubated without any issues with NG tube left in place. Patient was transferred back to the step down unit in stable condition. Given clinical improvement on 12/01/2013 he was transferred out of the step down unit to telemetry. given the overall improvement with minimal NG tube output, his NG tube was discontinued on 12/01/2013. He remains n.p.o. and is on IV vancomycin and Zosyn. Plan to repeat a.m. chest x-ray to followup on pneumonia. Patient had blood cultures drawn on 11/30/2013 which are now growing enterococcus.                                                                                                                                                                       Assessment/Plan: 1. Incarcerated left hernia,  patient taken to the OR on 11/28/2013 undergoing repair of recurrent incarcerated left inguinal hernia, with insertion of mesh. His NG tube was discontinued, he was on a clear liquid diet, he was tolerating and was advanced to full liquid.  General surgery following. 2. Community acquire pneumonia versus aspiration pneumonia. Prior to presentation, patient reported multiple episodes of nausea and vomiting and feels that he may have choked. A CT scan of lungs performed on admission showed multifocal pneumonia. Although he seems to be improving from a respiratory standpoint, blood cultures obtained on 11/30/2013 are now growing enterococcus. Continue current antibiotic regimen. Repeat blood cultures are negative so far and  CXR shows improvement in the left basilar patchy consolidation.    3. Sepsis, evidence by positive blood cultures, acute respiratory failure, could be secondary to underlying pneumonia. continue current antibiotic regimen. 4. Acute hypoxemic respiratory failure evidence by an O2 sat of 80% on admission. Likely secondary  to aspiration pneumonia versus community-acquired pneumonia. From a respiratory standpoint he is stable will continue require 4 L supplemental oxygen via nasal cannula. Continue broad-spectrum IV antibiotic therapy 5. Paroxysmal atrial fibrillation. Heart rates remained controlled.  6. Diet: clear liquid diet.  7. DVT prophylaxis. Subcutaneous heparin  Code Status: Full Code Disposition Plan: possible home when stable.  Discussed th plan of care with the patient and son.   Consultants:  General surgery  Pulmonary critical care medicine  ID  Procedures:  Repair of incarcerated inguinal hernia performed on 11/30/2013  Antibiotics:  Vancomycin (started on 11/30/2013)  Zosyn (started on 11/30/2013)  HPI/Subjective: Patient states doing better today, nontoxic appearing, on FULL liquid diet.   Objective: Filed Vitals:   12/03/13 1425  BP: 162/80   Pulse: 81  Temp: 98.6 F (37 C)  Resp: 16    Intake/Output Summary (Last 24 hours) at 12/03/13 1927 Last data filed at 12/03/13 1800  Gross per 24 hour  Intake 4130.83 ml  Output      0 ml  Net 4130.83 ml   Filed Weights   11/30/13 0437 11/30/13 0815  Weight: 99.3 kg (218 lb 14.7 oz) 105.6 kg (232 lb 12.9 oz)    Exam:   General:  Nontoxic, awake alert oriented times  Cardiovascular: Regular rate rhythm normal S1 is  Respiratory: Scattered rhonchi, crackles, on supplemental oxygen via nasal cannula  Abdomen: Status post hernia repair  Musculoskeletal: No edema   Data Reviewed: Basic Metabolic Panel:  Recent Labs Lab 11/27/13 0542 11/29/13 2325 11/30/13 0906 12/01/13 0340 12/02/13 0520  NA 134* 135 137 142 136  K 4.7 3.5 4.3 4.5 3.8  CL 101 93* 94* 102 99  CO2 25 32 30 35* 29  GLUCOSE 131* 188* 180* 122* 109*  BUN 26* 26* 33* 39* 29*  CREATININE 1.10 1.00 1.35 1.28 1.11  CALCIUM 8.1* 9.2 8.8 7.8* 7.6*  MG  --   --   --  4.6*  --   PHOS  --   --   --  5.0*  --    Liver Function Tests:  Recent Labs Lab 11/30/13 0906  AST 43*  ALT 37  ALKPHOS 61  BILITOT 0.9  PROT 6.2  ALBUMIN 2.9*   No results found for this basename: LIPASE, AMYLASE,  in the last 168 hours No results found for this basename: AMMONIA,  in the last 168 hours CBC:  Recent Labs Lab 11/28/13 0559 11/29/13 2325 11/30/13 1012 12/01/13 0340 12/02/13 0520  WBC 7.1 9.9 5.7 6.3 6.4  NEUTROABS  --  8.6* 5.0  --   --   HGB 7.3* 11.2* 11.9* 9.7* 9.3*  HCT 22.2* 32.9* 36.4* 29.8* 28.5*  MCV 92.9 90.9 91.9 93.1 94.4  PLT 159 227 214 213 209   Cardiac Enzymes: No results found for this basename: CKTOTAL, CKMB, CKMBINDEX, TROPONINI,  in the last 168 hours BNP (last 3 results) No results found for this basename: PROBNP,  in the last 8760 hours CBG:  Recent Labs Lab 11/30/13 1617 11/30/13 2015 11/30/13 2349 12/01/13 0345 12/01/13 0927  GLUCAP 136* 124* 119* 105* 117*     Recent Results (from the past 240 hour(s))  CULTURE, BLOOD (ROUTINE X 2)     Status: None   Collection Time    11/30/13  4:53 AM      Result Value Range Status   Specimen Description BLOOD RIGHT HAND   Final   Special Requests BOTTLES DRAWN AEROBIC ONLY 3CC  Final   Culture  Setup Time     Final   Value: 11/30/2013 09:21     Performed at [facility]   Culture     Final   Value: ENTEROCOCCUS SPECIES     Note: COMBINATION THERAPY OF HIGH DOSE AMPICILLIN OR VANCOMYCIN, PLUS AN AMINOGLYCOSIDE, IS USUALLY INDICATED FOR SERIOUS ENTEROCOCCAL INFECTIONS.     Note: Gram Stain Report Called to,Read Back By and Verified With: Max Sane ON 12/01/2013 AT 12:27A BY Serafina Mitchell     Performed at [facility]   Report Status 12/03/2013 FINAL   Final   Organism ID, Bacteria ENTEROCOCCUS SPECIES   Final  CULTURE, BLOOD (ROUTINE X 2)     Status: None   Collection Time    11/30/13  5:13 AM      Result Value Range Status   Specimen Description BLOOD LEFT HAND   Final   Special Requests BOTTLES DRAWN AEROBIC AND ANAEROBIC 3CC   Final   Culture  Setup Time     Final   Value: 11/30/2013 09:20     Performed at [facility]   Culture     Final   Value: ENTEROCOCCUS SPECIES     Note: SUSCEPTIBILITIES PERFORMED ON PREVIOUS CULTURE WITHIN THE LAST 5 DAYS.     Note: Gram Stain Report Called to,Read Back By and Verified With: Max Sane ON 12/01/2013 AT 12:27A BY Serafina Mitchell     Performed at [facility]   Report Status 12/03/2013 FINAL   Final  CULTURE, BLOOD (ROUTINE X 2)     Status: None   Collection Time    12/01/13  7:15 PM      Result Value Range Status   Specimen Description BLOOD RIGHT ARM   Final   Special Requests BOTTLES DRAWN AEROBIC ONLY [facility]   Final   Culture  Setup Time     Final   Value: 12/02/2013 00:52     Performed at [facility]   Culture     Final   Value:        BLOOD CULTURE RECEIVED NO GROWTH TO DATE CULTURE WILL BE HELD FOR 5 DAYS  BEFORE ISSUING A FINAL NEGATIVE REPORT     Performed at [facility]   Report Status PENDING   Incomplete  CULTURE, BLOOD (ROUTINE X 2)     Status: None   Collection Time    12/01/13  7:35 PM      Result Value Range Status   Specimen Description BLOOD RIGHT HAND   Final   Special Requests BOTTLES DRAWN AEROBIC ONLY 10CC   Final   Culture  Setup Time     Final   Value: 12/02/2013 00:52     Performed at [facility]   Culture     Final   Value:        BLOOD CULTURE RECEIVED NO GROWTH TO DATE CULTURE WILL BE HELD FOR 5 DAYS BEFORE ISSUING A FINAL NEGATIVE REPORT     Performed at [facility]   Report Status PENDING   Incomplete  CULTURE, BLOOD (ROUTINE X 2)     Status: None   Collection Time    12/02/13  2:19 PM      Result Value Range Status   Specimen Description BLOOD RIGHT ARM   Final   Special Requests BOTTLES DRAWN AEROBIC AND ANAEROBIC 5CC   Final   Culture  Setup Time     Final   Value:  12/02/2013 20:53     Performed at [facility]   Culture     Final   Value:        BLOOD CULTURE RECEIVED NO GROWTH TO DATE CULTURE WILL BE HELD FOR 5 DAYS BEFORE ISSUING A FINAL NEGATIVE REPORT     Performed at [facility]   Report Status PENDING   Incomplete  CULTURE, BLOOD (ROUTINE X 2)     Status: None   Collection Time    12/02/13  2:29 PM      Result Value Range Status   Specimen Description BLOOD LEFT HAND   Final   Special Requests BOTTLES DRAWN AEROBIC ONLY 1CC   Final   Culture  Setup Time     Final   Value: 12/02/2013 20:55     Performed at [facility]   Culture     Final   Value:        BLOOD CULTURE RECEIVED NO GROWTH TO DATE CULTURE WILL BE HELD FOR 5 DAYS BEFORE ISSUING A FINAL NEGATIVE REPORT     Performed at [facility]   Report Status PENDING   Incomplete     Studies: Dg Chest Port 1 View  12/02/2013   CLINICAL DATA:  Pneumonia  EXAM: PORTABLE CHEST - 1 VIEW  COMPARISON:  Yesterday  FINDINGS: Stable right  basilar subsegmental atelectasis. Improved left basilar patchy airspace disease. No pneumothorax. Normal heart size. NG tube removed.  IMPRESSION: Improved left basilar patchy consolidation.   Electronically Signed   By: Maryclare Bean M.D.   On: 12/02/2013 07:49    Scheduled Meds: . heparin subcutaneous  5,000 Units Subcutaneous Q8H  . levalbuterol  0.63 mg Nebulization Q6H WA  . pantoprazole (PROTONIX) IV  40 mg Intravenous Q24H  . piperacillin-tazobactam (ZOSYN)  IV  3.375 g Intravenous Q8H  . sodium chloride  3 mL Intravenous Q12H  . vancomycin  1,250 mg Intravenous Q12H   Continuous Infusions: . dextrose 5 % and 0.45 % NaCl with KCl 20 mEq/L 50 mL/hr at 12/02/13 1411    Principal Problem:   Enterococcal bacteremia Active Problems:   Postoperative anemia due to acute blood loss   HCAP (healthcare-associated pneumonia)   CAD (coronary artery disease)   HLD (hyperlipidemia)   Acute respiratory failure   Incarcerated inguinal hernia   History of total hip arthroplasty   Hyperglycemia   GERD (gastroesophageal reflux disease)   Ex-cigarette smoker    Time spent: 35 minutes    Sequoyah Ramone  [facility] Pager [phone]. If 7PM-7AM, please contact night-coverage at [email or web address], password [facility] 12/03/2013, 7:27 PM  LOS: 4 days

## 2013-12-04 ENCOUNTER — Encounter (HOSPITAL_COMMUNITY): Payer: Self-pay | Admitting: Surgery

## 2013-12-04 MED ORDER — ZOLPIDEM TARTRATE 5 MG PO TABS
5.0000 mg | ORAL_TABLET | Freq: Once | ORAL | Status: AC
  Administered 2013-12-04: 5 mg via ORAL
  Filled 2013-12-04: qty 1

## 2013-12-04 MED ORDER — SODIUM CHLORIDE 0.9 % IJ SOLN
10.0000 mL | Freq: Two times a day (BID) | INTRAMUSCULAR | Status: DC
  Administered 2013-12-04: 3 mL

## 2013-12-04 MED ORDER — LEVOFLOXACIN 500 MG PO TABS
500.0000 mg | ORAL_TABLET | Freq: Every day | ORAL | Status: DC
  Administered 2013-12-04 – 2013-12-05 (×2): 500 mg via ORAL
  Filled 2013-12-04 (×2): qty 1

## 2013-12-04 MED ORDER — SODIUM CHLORIDE 0.9 % IJ SOLN
10.0000 mL | INTRAMUSCULAR | Status: DC | PRN
  Administered 2013-12-04 – 2013-12-05 (×2): 10 mL

## 2013-12-04 NOTE — Progress Notes (Signed)
Physical Therapy Treatment Patient Details Name: Barry Morgan MRN: [id number] DOB: January 03, 1941 Today's Date: 12/04/2013 Time: 4098-1191 PT Time Calculation (min): 18 min  PT Assessment / Plan / Recommendation  History of Present Illness SBO, PNA, recent THA   PT Comments   Pt amb self in room and in hallway reports nursing staff.  Pt declines going back to [facility] and is requesting to D/C directly to home.  Amb pt in hallway and negotiated stairs effectively/safely.  Will consult LPT that pt plans to D/C to home and declines the need for any AD.   Follow Up Recommendations  Home health PT (pt declines going back to [facility] and requests to D/C to home will consult LPT)     Does the patient have the potential to tolerate intense rehabilitation     Barriers to Discharge        Equipment Recommendations   (Pt declines the need for any AD)    Recommendations for Other Services    Frequency Min 3X/week   Progress towards PT Goals Progress towards PT goals: Progressing toward goals  Plan      Precautions / Restrictions Precautions Precautions: None Restrictions Weight Bearing Restrictions: No Other Position/Activity Restrictions: WBAT    Pertinent Vitals/Pain No c/o pain    Mobility  Bed Mobility Bed Mobility: Not assessed Details for Bed Mobility Assistance: Pt OOB amb self in room Transfers Transfers: Not assessed Details for Transfer Assistance: Pt amb self around room w/o AD Ambulation/Gait Ambulation/Gait Assistance: 5: Supervision Ambulation Distance (Feet): 350 Feet Assistive device: None Ambulation/Gait Assistance Details: good alternating gait with <25% steady self with wall rail.  Declined use of RW "I don't need that"  Avg RA sats 97% with no c/o. Gait Pattern: Within Functional Limits Stairs Assistance: 4: Min guard Stairs Assistance Details (indicate cue type and reason): 25% VC's on safety with decending to step to and not alternate  steps. Stair Management Technique: One rail Right    PT Goals (current goals can now be found in the care plan section)    Visit Information  Last PT Received On: 12/04/13 Assistance Needed: +1 History of Present Illness: SBO, PNA, recent THA    Subjective Data      Cognition       Balance     End of Session PT - End of Session Equipment Utilized During Treatment: Gait belt Activity Tolerance: Patient tolerated treatment well Patient left: in chair;with call bell/phone within reach   Felecia Shelling  PTA [facility]  Acute  Rehab Pager      [phone]

## 2013-12-04 NOTE — Progress Notes (Signed)
[facility] PROGRESS NOTE  Barry Morgan WUJ:[id number] DOB: [birth year] DOA: 11/29/2013 PCP: Vernona Rieger, MD  Interim Summary Patient is a pleasant 72 year old gentleman with a past medical history of hypertension, dyslipidemia, presented to the emergency department on 11/30/2013 with complaints of abdominal pain associated with nausea,vomiting, and abdominal distention. He was also found to be in acute hypoxemic respiratory failure in emergent apartment requiring supplemental oxygen. A CT scan of abdomen and pelvis showed a distal obstruction at the level of the proximal sigmoid colon due to a small to moderate left inguinal hernia containing segment of inflamed proximal sigmoid colon. There was diffuse distention of the colon with fluid measuring up to 11.1 cm. A CT scan of lungs showed multifocal pneumonia prominent at the left lower lobe. He was started on broad-spectrum IV antibiotic therapy with vancomycin and Zosyn, admitted to the step and unit. General surgery and pulmonary critical care medicine were consulted. He was taken to the OR on 11/30/2013 where he underwent repair of incarcerated left inguinal hernia with insertion of mesh. He tolerated procedure well, extubated without any issues with NG tube left in place. Patient was transferred back to the step down unit in stable condition. Given clinical improvement on 12/01/2013 he was transferred out of the step down unit to telemetry. given the overall improvement with minimal NG tube output, his NG tube was discontinued on 12/01/2013. He remains n.p.o. and is on IV vancomycin and Zosyn. Plan to repeat a.m. chest x-ray to followup on pneumonia. Patient had blood cultures drawn on 11/30/2013 which are now growing enterococcus.                                                                                                                                                                       Assessment/Plan: 1. Incarcerated left hernia,  patient taken to the OR on 11/28/2013 undergoing repair of recurrent incarcerated left inguinal hernia, with insertion of mesh. His NG tube was discontinued, he was on a clear liquid diet, he was tolerating and was advanced to full liquid and regular diet today.  General surgery following. 2. Community acquire pneumonia versus aspiration pneumonia. Prior to presentation, patient reported multiple episodes of nausea and vomiting and feels that he may have choked. A CT scan of lungs performed on admission showed multifocal pneumonia. Although he seems to be improving from a respiratory standpoint, blood cultures obtained on 11/30/2013 are now growing enterococcus. Continue current antibiotic regimen. Repeat blood cultures are negative so far and  CXR shows improvement in the left basilar patchy consolidation.    3. Sepsis, evidence by positive blood cultures, acute respiratory failure, could be secondary to underlying pneumonia. continue current antibiotic regimen. 4. Acute hypoxemic respiratory failure evidence by an O2 sat of 80%  on admission. Likely secondary to aspiration pneumonia versus community-acquired pneumonia. From a respiratory standpoint he is stable will continue require 4 L supplemental oxygen via nasal cannula. Continue broad-spectrum IV antibiotic therapy 5. Paroxysmal atrial fibrillation. Heart rates remained controlled.  6. Diet: clear liquid diet.  7. DVT prophylaxis. Subcutaneous heparin  Code Status: Full Code Disposition Plan: possible home when stable.  Discussed th plan of care with the patient and son.   Consultants:  General surgery  Pulmonary critical care medicine  ID  Procedures:  Repair of incarcerated inguinal hernia performed on 11/30/2013  Antibiotics:  Vancomycin (started on 11/30/2013)  Zosyn (started on 11/30/2013)  HPI/Subjective: Patient states doing better today, nontoxic appearing,on regular diet.   Objective: Filed Vitals:   12/04/13 1349   BP: 152/76  Pulse: 85  Temp: 97.9 F (36.6 C)  Resp: 18    Intake/Output Summary (Last 24 hours) at 12/04/13 1909 Last data filed at 12/04/13 1240  Gross per 24 hour  Intake    840 ml  Output      0 ml  Net    840 ml   Filed Weights   11/30/13 0437 11/30/13 0815  Weight: 99.3 kg (218 lb 14.7 oz) 105.6 kg (232 lb 12.9 oz)    Exam:   General:  Nontoxic, awake alert oriented times  Cardiovascular: Regular rate rhythm normal S1 is  Respiratory: Scattered rhonchi, crackles, on supplemental oxygen via nasal cannula  Abdomen: Status post hernia repair  Musculoskeletal: No edema   Data Reviewed: Basic Metabolic Panel:  Recent Labs Lab 11/29/13 2325 11/30/13 0906 12/01/13 0340 12/02/13 0520  NA 135 137 142 136  K 3.5 4.3 4.5 3.8  CL 93* 94* 102 99  CO2 32 30 35* 29  GLUCOSE 188* 180* 122* 109*  BUN 26* 33* 39* 29*  CREATININE 1.00 1.35 1.28 1.11  CALCIUM 9.2 8.8 7.8* 7.6*  MG  --   --  4.6*  --   PHOS  --   --  5.0*  --    Liver Function Tests:  Recent Labs Lab 11/30/13 0906  AST 43*  ALT 37  ALKPHOS 61  BILITOT 0.9  PROT 6.2  ALBUMIN 2.9*   No results found for this basename: LIPASE, AMYLASE,  in the last 168 hours No results found for this basename: AMMONIA,  in the last 168 hours CBC:  Recent Labs Lab 11/28/13 0559 11/29/13 2325 11/30/13 1012 12/01/13 0340 12/02/13 0520  WBC 7.1 9.9 5.7 6.3 6.4  NEUTROABS  --  8.6* 5.0  --   --   HGB 7.3* 11.2* 11.9* 9.7* 9.3*  HCT 22.2* 32.9* 36.4* 29.8* 28.5*  MCV 92.9 90.9 91.9 93.1 94.4  PLT 159 227 214 213 209   Cardiac Enzymes: No results found for this basename: CKTOTAL, CKMB, CKMBINDEX, TROPONINI,  in the last 168 hours BNP (last 3 results) No results found for this basename: PROBNP,  in the last 8760 hours CBG:  Recent Labs Lab 11/30/13 1617 11/30/13 2015 11/30/13 2349 12/01/13 0345 12/01/13 0927  GLUCAP 136* 124* 119* 105* 117*    Recent Results (from the past 240 hour(s))   CULTURE, BLOOD (ROUTINE X 2)     Status: None   Collection Time    11/30/13  4:53 AM      Result Value Range Status   Specimen Description BLOOD RIGHT HAND   Final   Special Requests BOTTLES DRAWN AEROBIC ONLY 3CC   Final   Culture  Setup Time  Final   Value: 11/30/2013 09:21     Performed at [facility]   Culture     Final   Value: ENTEROCOCCUS SPECIES     Note: COMBINATION THERAPY OF HIGH DOSE AMPICILLIN OR VANCOMYCIN, PLUS AN AMINOGLYCOSIDE, IS USUALLY INDICATED FOR SERIOUS ENTEROCOCCAL INFECTIONS.     Note: Gram Stain Report Called to,Read Back By and Verified With: Max Sane ON 12/01/2013 AT 12:27A BY Serafina Mitchell     Performed at [facility]   Report Status 12/03/2013 FINAL   Final   Organism ID, Bacteria ENTEROCOCCUS SPECIES   Final  CULTURE, BLOOD (ROUTINE X 2)     Status: None   Collection Time    11/30/13  5:13 AM      Result Value Range Status   Specimen Description BLOOD LEFT HAND   Final   Special Requests BOTTLES DRAWN AEROBIC AND ANAEROBIC 3CC   Final   Culture  Setup Time     Final   Value: 11/30/2013 09:20     Performed at [facility]   Culture     Final   Value: ENTEROCOCCUS SPECIES     Note: SUSCEPTIBILITIES PERFORMED ON PREVIOUS CULTURE WITHIN THE LAST 5 DAYS.     Note: Gram Stain Report Called to,Read Back By and Verified With: Max Sane ON 12/01/2013 AT 12:27A BY Serafina Mitchell     Performed at [facility]   Report Status 12/03/2013 FINAL   Final  CULTURE, BLOOD (ROUTINE X 2)     Status: None   Collection Time    12/01/13  7:15 PM      Result Value Range Status   Specimen Description BLOOD RIGHT ARM   Final   Special Requests BOTTLES DRAWN AEROBIC ONLY [facility]   Final   Culture  Setup Time     Final   Value: 12/02/2013 00:52     Performed at [facility]   Culture     Final   Value:        BLOOD CULTURE RECEIVED NO GROWTH TO DATE CULTURE WILL BE HELD FOR 5 DAYS BEFORE ISSUING A FINAL NEGATIVE REPORT      Performed at [facility]   Report Status PENDING   Incomplete  CULTURE, BLOOD (ROUTINE X 2)     Status: None   Collection Time    12/01/13  7:35 PM      Result Value Range Status   Specimen Description BLOOD RIGHT HAND   Final   Special Requests BOTTLES DRAWN AEROBIC ONLY 10CC   Final   Culture  Setup Time     Final   Value: 12/02/2013 00:52     Performed at [facility]   Culture     Final   Value:        BLOOD CULTURE RECEIVED NO GROWTH TO DATE CULTURE WILL BE HELD FOR 5 DAYS BEFORE ISSUING A FINAL NEGATIVE REPORT     Performed at [facility]   Report Status PENDING   Incomplete  CULTURE, BLOOD (ROUTINE X 2)     Status: None   Collection Time    12/02/13  2:19 PM      Result Value Range Status   Specimen Description BLOOD RIGHT ARM   Final   Special Requests BOTTLES DRAWN AEROBIC AND ANAEROBIC 5CC   Final   Culture  Setup Time     Final   Value: 12/02/2013 20:53     Performed at [facility]  Culture     Final   Value:        BLOOD CULTURE RECEIVED NO GROWTH TO DATE CULTURE WILL BE HELD FOR 5 DAYS BEFORE ISSUING A FINAL NEGATIVE REPORT     Performed at [facility]   Report Status PENDING   Incomplete  CULTURE, BLOOD (ROUTINE X 2)     Status: None   Collection Time    12/02/13  2:29 PM      Result Value Range Status   Specimen Description BLOOD LEFT HAND   Final   Special Requests BOTTLES DRAWN AEROBIC ONLY 1CC   Final   Culture  Setup Time     Final   Value: 12/02/2013 20:55     Performed at [facility]   Culture     Final   Value:        BLOOD CULTURE RECEIVED NO GROWTH TO DATE CULTURE WILL BE HELD FOR 5 DAYS BEFORE ISSUING A FINAL NEGATIVE REPORT     Performed at [facility]   Report Status PENDING   Incomplete     Studies: No results found.  Scheduled Meds: . heparin subcutaneous  5,000 Units Subcutaneous Q8H  . levalbuterol  0.63 mg Nebulization Q6H WA  . levofloxacin  500 mg Oral Daily  .  pantoprazole (PROTONIX) IV  40 mg Intravenous Q24H  . sodium chloride  10-40 mL Intracatheter Q12H  . sodium chloride  3 mL Intravenous Q12H  . vancomycin  1,250 mg Intravenous Q12H   Continuous Infusions: . dextrose 5 % and 0.45 % NaCl with KCl 20 mEq/L 50 mL/hr at 12/04/13 0154    Principal Problem:   Incarcerated inguinal hernia Active Problems:   Postoperative anemia due to acute blood loss   HCAP (healthcare-associated pneumonia)   CAD (coronary artery disease)   HLD (hyperlipidemia)   Acute respiratory failure   Enterococcal bacteremia   History of total hip arthroplasty   Hyperglycemia   GERD (gastroesophageal reflux disease)   Ex-cigarette smoker    Time spent: 35 minutes    Barry Morgan  [facility] Pager [phone]. If 7PM-7AM, please contact night-coverage at [email or web address], password [facility] 12/04/2013, 7:09 PM  LOS: 5 days

## 2013-12-04 NOTE — Progress Notes (Signed)
[facility]  Patient Status: New  [facility] is providing the following services: RN, PT and Home Infusion Services (teaching and education will be done by nurse in the home with patient and caregiver)   PLEASE CONTACT [facility] OFFICE AT [phone] OVER THE WEEKEND WITH DISCHARGE PLANS AND IV ABX RX.   Thank you!  If patient discharges after hours, please call [phone].   Lanae Crumbly 12/04/2013, 12:37 PM

## 2013-12-04 NOTE — Progress Notes (Signed)
4 Days Post-Op   Assessment: s/p Procedure(s): HERNIA REPAIR INGUINAL INCARCERATED INSERTION OF MESH Patient Active Problem List   Diagnosis Date Noted  . Incarcerated inguinal hernia 12/02/2013  . Enterococcal bacteremia 12/02/2013  . History of total hip arthroplasty 12/02/2013  . Hyperglycemia 12/02/2013  . GERD (gastroesophageal reflux disease) 12/02/2013  . Ex-cigarette smoker 12/02/2013  . HCAP (healthcare-associated pneumonia) 11/30/2013  . CAD (coronary artery disease) 11/30/2013  . HLD (hyperlipidemia) 11/30/2013  . Acute respiratory failure 11/30/2013  . Hyponatremia 11/26/2013  . Postoperative anemia due to acute blood loss 11/26/2013  . OA (osteoarthritis) of hip 11/25/2013    Improving, having some loose stools, but this is often the case after a bowel obstruction is relieved  Plan: Discharge When OK with primary care. Appears he will need IV antibiotics at home. Will advance diet  Subjective: Feels good and wants some solid food. Minimal pain, no nausea, having some loose stools  Objective: Vital signs in last 24 hours: Temp:  [98.2 F (36.8 C)-99.4 F (37.4 C)] 98.2 F (36.8 C) (12/26 0544) Pulse Rate:  [77-81] 81 (12/26 0544) Resp:  [16] 16 (12/26 0544) BP: (136-162)/(67-80) 136/67 mmHg (12/26 0544) SpO2:  [92 %-97 %] 95 % (12/26 0544)   Intake/Output from previous day: 12/25 0701 - 12/26 0700 In: 3040 [P.O.:2040; I.V.:650; IV Piggyback:350] Out: -   General appearance: alert, cooperative and no distress GI: soft, non-tender; bowel sounds normal; no masses,  no organomegaly  Incision: healing well  Lab Results:   Recent Labs  12/02/13 0520  WBC 6.4  HGB 9.3*  HCT 28.5*  PLT 209   BMET  Recent Labs  12/02/13 0520  NA 136  K 3.8  CL 99  CO2 29  GLUCOSE 109*  BUN 29*  CREATININE 1.11  CALCIUM 7.6*    MEDS, Scheduled . heparin subcutaneous  5,000 Units Subcutaneous Q8H  . levalbuterol  0.63 mg Nebulization Q6H WA  .  pantoprazole (PROTONIX) IV  40 mg Intravenous Q24H  . piperacillin-tazobactam (ZOSYN)  IV  3.375 g Intravenous Q8H  . sodium chloride  3 mL Intravenous Q12H  . vancomycin  1,250 mg Intravenous Q12H    Studies/Results: No results found.    LOS: 5 days     Currie Paris, MD, [facility], [place] [phone]   12/04/2013 10:00 AM

## 2013-12-04 NOTE — Progress Notes (Signed)
Peripherally Inserted Central Catheter/Midline Placement  The IV Nurse has discussed with the patient and/or persons authorized to consent for the patient, the purpose of this procedure and the potential benefits and risks involved with this procedure.  The benefits include less needle sticks, lab draws from the catheter and patient may be discharged home with the catheter.  Risks include, but not limited to, infection, bleeding, blood clot (thrombus formation), and puncture of an artery; nerve damage and irregular heat beat.  Alternatives to this procedure were also discussed.  PICC/Midline Placement Documentation        Vevelyn Pat 12/04/2013, 2:46 PM

## 2013-12-04 NOTE — Care Management Note (Signed)
    Page 1 of 2   12/04/2013     12:15:42 PM   CARE MANAGEMENT NOTE 12/04/2013  Patient:  Barry Morgan, Barry Morgan   Account Number:  [id number]  Date Initiated:  12/01/2013  Documentation initiated by:  DAVIS,RHONDA  Subjective/Objective Assessment:   pt with recent history of orif and now present with incarerate hernia and sbo     Action/Plan:   transfered to sdu due to hypoxia and requiring  o2 at 40% via nrb and then   from assisted living at [facility]   Anticipated DC Date:  12/06/2013   Anticipated DC Plan:  HOME W HOME HEALTH SERVICES  In-house referral  Clinical Social Worker      DC Planning Services  CM consult      [facility] Choice  NA   Choice offered to / List presented to:  C-1 Patient   DME arranged  NA      DME agency  NA     HH arranged  NA      HH agency  NA   Status of service:  In process, will continue to follow Medicare Important Message given?  NA - LOS <3 / Initial given by admissions (If response is "NO", the following Medicare IM given date fields will be blank) Date Medicare IM given:   Date Additional Medicare IM given:    Discharge Disposition:    Per UR Regulation:  Reviewed for med. necessity/level of care/duration of stay  If discussed at Long Length of Stay Meetings, dates discussed:    Comments:  12/04/13 Duke Weisensel RN,BSN NCM [phone] EXPLAINED IN DETAIL TO PATIENT DIFFERENCE BETWEEN HHC-INTERMITTENT,& SNF-STRUCTURED REHAB.PT NOW RECOMMENDS-HH.[facility] CHOSEN FOR HH.TC KRISTEN AWARE OF REFERRAL-HOME IV ABX-WILL TELL PATIENT ABOUT HIS CO PAY FOR IV VANC.HHRN-LONG TERM IV ABX,HHPT-EXERCISES.PATIENT VOICED UNDERSTANDING.AWAIT FINAL HH ORDERS.  [id number] Barry Plater, RN, BSN, [place] [phone] Chart Reviewed for discharge and hospital needs. Discharge needs at time of review:  None present will follow for needs. Review of patient progress due on [id number].

## 2013-12-04 NOTE — Progress Notes (Signed)
Clinical Social Work  Patient requested to speak with CSW at bedside. CSW met with patient and introduced myself. Patient reports he is feeling better and wants to DC home instead of back to SNF. CSW informed [facility] that patient would not be returning. Patient asked questions regarding insurance covering [facility]. CSW encouraged patient to ask agency about insurance questions. Patient reports no further needs but thanked CSW for time.  CSW is signing off but available if further needs arise.  [place], [place] [phone]

## 2013-12-04 NOTE — Progress Notes (Signed)
Patient ID: Barry Morgan, male   DOB: 1940/12/27, 72 y.o.   MRN: [id number]         [facility] for Infectious Disease    Date of Admission:  11/29/2013   Total days of antibiotics 4         Principal Problem:   Incarcerated inguinal hernia Active Problems:   HCAP (healthcare-associated pneumonia)   Enterococcal bacteremia   History of total hip arthroplasty   Postoperative anemia due to acute blood loss   CAD (coronary artery disease)   HLD (hyperlipidemia)   Acute respiratory failure   Hyperglycemia   GERD (gastroesophageal reflux disease)   Ex-cigarette smoker   . heparin subcutaneous  5,000 Units Subcutaneous Q8H  . levalbuterol  0.63 mg Nebulization Q6H WA  . pantoprazole (PROTONIX) IV  40 mg Intravenous Q24H  . piperacillin-tazobactam (ZOSYN)  IV  3.375 g Intravenous Q8H  . sodium chloride  3 mL Intravenous Q12H  . vancomycin  1,250 mg Intravenous Q12H    Subjective: He did not sleep well last night. He states that being in the hospital is getting on his nerves and he is eager to go home. He still has some mild cough but no shortness of breath. His abdominal pain has resolved.  Review of Systems: Pertinent items are noted in HPI.  Past Medical History  Diagnosis Date  . Hyperlipidemia   . Eye globe prosthesis     RIGHT  . GERD (gastroesophageal reflux disease)   . Hypertension   . Dysrhythmia   . BPH (benign prostatic hypertrophy)   . PONV (postoperative nausea and vomiting)     SICK FOR 3 DAYS AFTER COLONOSCOPY  . Coronary artery disease     DRUG ELUTING STENT DISTAL RCA 04/2012  . Shortness of breath     ONLY IF CLIMBING 2 OR 3 FLIGHTS OF STAIRS  . Arthritis     PAIN AND OA LEFT HIP  . Incarcerated inguinal hernia 12/02/2013    Incarcerated left inguinal hernia repaired 11/30/13 with mesh     History  Substance Use Topics  . Smoking status: Former Smoker    Types: Cigars  . Smokeless tobacco: Never Used  . Alcohol Use: Yes     Comment: QUIT  SMOKING 15 YRS AGO   COUPLE OF GLASSES WINE DURING WEEK    History reviewed. No pertinent family history.  No Known Allergies  Objective: Temp:  [98.2 F (36.8 C)-99.4 F (37.4 C)] 98.2 F (36.8 C) (12/26 0544) Pulse Rate:  [77-81] 81 (12/26 0544) Resp:  [16] 16 (12/26 0544) BP: (136-162)/(67-80) 136/67 mmHg (12/26 0544) SpO2:  [92 %-97 %] 95 % (12/26 0544)  General: He is in no distress sitting up in a chair Skin: No rash Lungs: Few crackles bilaterally Cor: Distant but regular S1 and S2 with no murmurs Abdomen: Still slightly distended but soft and nontender Left inguinal incision looks good. Left hip incision looks good.  Lab Results Lab Results  Component Value Date   WBC 6.4 12/02/2013   HGB 9.3* 12/02/2013   HCT 28.5* 12/02/2013   MCV 94.4 12/02/2013   PLT 209 12/02/2013    Lab Results  Component Value Date   CREATININE 1.11 12/02/2013   BUN 29* 12/02/2013   NA 136 12/02/2013   K 3.8 12/02/2013   CL 99 12/02/2013   CO2 29 12/02/2013    Lab Results  Component Value Date   ALT 37 11/30/2013   AST 43* 11/30/2013   ALKPHOS 61 11/30/2013  BILITOT 0.9 11/30/2013      Microbiology: Recent Results (from the past 240 hour(s))  CULTURE, BLOOD (ROUTINE X 2)     Status: None   Collection Time    11/30/13  4:53 AM      Result Value Range Status   Specimen Description BLOOD RIGHT HAND   Final   Special Requests BOTTLES DRAWN AEROBIC ONLY 3CC   Final   Culture  Setup Time     Final   Value: 11/30/2013 09:21     Performed at [facility]   Culture     Final   Value: ENTEROCOCCUS SPECIES     Note: COMBINATION THERAPY OF HIGH DOSE AMPICILLIN OR VANCOMYCIN, PLUS AN AMINOGLYCOSIDE, IS USUALLY INDICATED FOR SERIOUS ENTEROCOCCAL INFECTIONS.     Note: Gram Stain Report Called to,Read Back By and Verified With: Max Sane ON 12/01/2013 AT 12:27A BY Serafina Mitchell     Performed at [facility]   Report Status 12/03/2013 FINAL   Final   Organism ID,  Bacteria ENTEROCOCCUS SPECIES   Final  CULTURE, BLOOD (ROUTINE X 2)     Status: None   Collection Time    11/30/13  5:13 AM      Result Value Range Status   Specimen Description BLOOD LEFT HAND   Final   Special Requests BOTTLES DRAWN AEROBIC AND ANAEROBIC 3CC   Final   Culture  Setup Time     Final   Value: 11/30/2013 09:20     Performed at [facility]   Culture     Final   Value: ENTEROCOCCUS SPECIES     Note: SUSCEPTIBILITIES PERFORMED ON PREVIOUS CULTURE WITHIN THE LAST 5 DAYS.     Note: Gram Stain Report Called to,Read Back By and Verified With: Max Sane ON 12/01/2013 AT 12:27A BY Serafina Mitchell     Performed at [facility]   Report Status 12/03/2013 FINAL   Final  CULTURE, BLOOD (ROUTINE X 2)     Status: None   Collection Time    12/01/13  7:15 PM      Result Value Range Status   Specimen Description BLOOD RIGHT ARM   Final   Special Requests BOTTLES DRAWN AEROBIC ONLY [facility]   Final   Culture  Setup Time     Final   Value: 12/02/2013 00:52     Performed at [facility]   Culture     Final   Value:        BLOOD CULTURE RECEIVED NO GROWTH TO DATE CULTURE WILL BE HELD FOR 5 DAYS BEFORE ISSUING A FINAL NEGATIVE REPORT     Performed at [facility]   Report Status PENDING   Incomplete  CULTURE, BLOOD (ROUTINE X 2)     Status: None   Collection Time    12/01/13  7:35 PM      Result Value Range Status   Specimen Description BLOOD RIGHT HAND   Final   Special Requests BOTTLES DRAWN AEROBIC ONLY 10CC   Final   Culture  Setup Time     Final   Value: 12/02/2013 00:52     Performed at [facility]   Culture     Final   Value:        BLOOD CULTURE RECEIVED NO GROWTH TO DATE CULTURE WILL BE HELD FOR 5 DAYS BEFORE ISSUING A FINAL NEGATIVE REPORT     Performed at [facility]   Report Status PENDING   Incomplete  CULTURE, BLOOD (ROUTINE X 2)     Status: None   Collection Time    12/02/13  2:19 PM      Result Value Range Status    Specimen Description BLOOD RIGHT ARM   Final   Special Requests BOTTLES DRAWN AEROBIC AND ANAEROBIC 5CC   Final   Culture  Setup Time     Final   Value: 12/02/2013 20:53     Performed at [facility]   Culture     Final   Value:        BLOOD CULTURE RECEIVED NO GROWTH TO DATE CULTURE WILL BE HELD FOR 5 DAYS BEFORE ISSUING A FINAL NEGATIVE REPORT     Performed at [facility]   Report Status PENDING   Incomplete  CULTURE, BLOOD (ROUTINE X 2)     Status: None   Collection Time    12/02/13  2:29 PM      Result Value Range Status   Specimen Description BLOOD LEFT HAND   Final   Special Requests BOTTLES DRAWN AEROBIC ONLY 1CC   Final   Culture  Setup Time     Final   Value: 12/02/2013 20:55     Performed at [facility]   Culture     Final   Value:        BLOOD CULTURE RECEIVED NO GROWTH TO DATE CULTURE WILL BE HELD FOR 5 DAYS BEFORE ISSUING A FINAL NEGATIVE REPORT     Performed at [facility]   Report Status PENDING   Incomplete    Studies/Results: No results found.  Assessment: He is improving on therapy for HCAP and enterococcal bacteremia probably related to his incarcerated inguinal hernia. His repeat blood cultures are negative. He'll have a PICC placed and plan on at least 2 weeks of IV vancomycin for his bacteremia. I will change piperacillin tazobactam 2 levofloxacin and treat 4 more days for his pneumonia.  Plan: 1. Continue IV vancomycin for 10 more days 2. Change piperacillin tazobactam to oral levofloxacin and treat for 4 more days 3. I will arrange followup in my clinic in early January  Cliffton Asters, MD [place] [facility] for Infectious Disease [facility] [phone] pager   [phone] cell 12/04/2013, 10:55 AM

## 2013-12-05 DIAGNOSIS — R7881 Bacteremia: Secondary | ICD-10-CM

## 2013-12-05 DIAGNOSIS — J189 Pneumonia, unspecified organism: Secondary | ICD-10-CM

## 2013-12-05 DIAGNOSIS — B952 Enterococcus as the cause of diseases classified elsewhere: Secondary | ICD-10-CM

## 2013-12-05 LAB — BASIC METABOLIC PANEL
CO2: 23 mEq/L (ref 19–32)
Chloride: 104 mEq/L (ref 96–112)
Creatinine, Ser: 1.01 mg/dL (ref 0.50–1.35)
GFR calc Af Amer: 84 mL/min — ABNORMAL LOW (ref >90)
GFR calc non Af Amer: 72 mL/min — ABNORMAL LOW (ref >90)
Sodium: 137 mEq/L (ref 135–145)

## 2013-12-05 LAB — CBC
HCT: 28.1 % — ABNORMAL LOW (ref 39.0–52.0)
MCV: 92.4 fL (ref 78.0–100.0)
Platelets: 290 10*3/uL (ref 150–400)
RBC: 3.04 MIL/uL — ABNORMAL LOW (ref 4.22–5.81)
RDW: 14.8 % (ref 11.5–15.5)
WBC: 7.8 10*3/uL (ref 4.0–10.5)

## 2013-12-05 LAB — VANCOMYCIN, TROUGH: Vancomycin Tr: 13.3 ug/mL (ref 10.0–20.0)

## 2013-12-05 MED ORDER — VANCOMYCIN HCL 10 G IV SOLR
1500.0000 mg | Freq: Two times a day (BID) | INTRAVENOUS | Status: DC
  Filled 2013-12-05: qty 1500

## 2013-12-05 MED ORDER — LEVOFLOXACIN 500 MG PO TABS: 500.0000 mg | ORAL_TABLET | Freq: Every day | ORAL | Status: DC

## 2013-12-05 MED ORDER — HEPARIN SOD (PORK) LOCK FLUSH 100 UNIT/ML IV SOLN
250.0000 [IU] | INTRAVENOUS | Status: AC | PRN
  Administered 2013-12-05: 250 [IU]

## 2013-12-05 MED ORDER — CLOPIDOGREL BISULFATE 75 MG PO TABS: 75.0000 mg | ORAL_TABLET | Freq: Every day | ORAL | Status: AC

## 2013-12-05 MED ORDER — VANCOMYCIN HCL 10 G IV SOLR: 1250.0000 mg | Freq: Two times a day (BID) | INTRAVENOUS | Status: DC

## 2013-12-05 MED ORDER — VANCOMYCIN HCL 10 G IV SOLR: 1500.0000 mg | Freq: Two times a day (BID) | INTRAVENOUS | Status: DC

## 2013-12-05 MED ORDER — ASPIRIN EC 81 MG PO TBEC: 81.0000 mg | DELAYED_RELEASE_TABLET | Freq: Every day | ORAL | Status: AC

## 2013-12-05 NOTE — Discharge Summary (Signed)
Physician Discharge Summary  Barry Morgan ZOX:[id number] DOB: 06/15/1941 DOA: 11/29/2013  PCP: Vernona Rieger, MD  Admit date: 11/29/2013 Discharge date: 12/05/2013  Time spent: 35 minutes  Recommendations for Outpatient Follow-up:  1. Follow up with PCP /ID/ surgery as recommended.   Discharge Diagnoses:  Principal Problem:   Incarcerated inguinal hernia Active Problems:   Postoperative anemia due to acute blood loss   HCAP (healthcare-associated pneumonia)   CAD (coronary artery disease)   HLD (hyperlipidemia)   Acute respiratory failure   Enterococcal bacteremia   History of total hip arthroplasty   Hyperglycemia   GERD (gastroesophageal reflux disease)   Ex-cigarette smoker   Discharge Condition: improved.   Diet recommendation: REGULAR DIET  Filed Weights   11/30/13 0437 11/30/13 0815  Weight: 99.3 kg (218 lb 14.7 oz) 105.6 kg (232 lb 12.9 oz)    History of present illness:   Patient is a pleasant 72 year old gentleman with a past medical history of hypertension, dyslipidemia, presented to the emergency department on 11/30/2013 with complaints of abdominal pain associated with nausea,vomiting, and abdominal distention. He was also found to be in acute hypoxemic respiratory failure in emergent apartment requiring supplemental oxygen. A CT scan of abdomen and pelvis showed a distal obstruction at the level of the proximal sigmoid colon due to a small to moderate left inguinal hernia containing segment of inflamed proximal sigmoid colon. There was diffuse distention of the colon with fluid measuring up to 11.1 cm. A CT scan of lungs showed multifocal pneumonia prominent at the left lower lobe. He was started on broad-spectrum IV antibiotic therapy with vancomycin and Zosyn, admitted to the step and unit. General surgery and pulmonary critical care medicine were consulted. He was taken to the OR on 11/30/2013 where he underwent repair of incarcerated left inguinal hernia with  insertion of mesh. He tolerated procedure well, extubated without any issues with NG tube left in place. Patient was transferred back to the step down unit in stable condition. Given clinical improvement on 12/01/2013 he was transferred out of the step down unit to telemetry. given the overall improvement with minimal NG tube output, his NG tube was discontinued on 12/01/2013. He remains n.p.o. and is on IV vancomycin and Zosyn. Plan to repeat a.m. chest x-ray to followup on pneumonia. Patient had blood cultures drawn on 11/30/2013 which are now growing enterococcus.   Hospital Course:   Incarcerated left hernia, patient taken to the OR on 11/28/2013 undergoing repair of recurrent incarcerated left inguinal hernia, with insertion of mesh. His NG tube was discontinued, he was on a clear liquid diet, he was tolerating and was advanced to full liquid and regular diet today. Recommend follow up with surgery as outpatient.  Community acquire pneumonia versus aspiration pneumonia. Prior to presentation, patient reported multiple episodes of nausea and vomiting and feels that he may have choked. A CT scan of lungs performed on admission showed multifocal pneumonia. Although he seems to be improving from a respiratory standpoint, blood cultures obtained on 11/30/2013 are now growing enterococcus. Continue current antibiotic regimen. Repeat blood cultures are negative so far and CXR shows improvement in the left basilar patchy consolidation.   Sepsis, evidence by positive blood cultures, acute respiratory failure, could be secondary to underlying pneumonia. continue current antibiotic regimen as per ID. Recommend vancomycin till jan 7 with oral levaquin for another 3 days.    Acute hypoxemic respiratory failure evidence by an O2 sat of 80% on admission. Likely secondary to aspiration pneumonia versus community-acquired pneumonia. From  a respiratory standpoint he is stable will continue require 4 L supplemental  oxygen via nasal cannula.  Continue broad-spectrum IV antibiotic therapy   Paroxysmal atrial fibrillation. Heart rates remained controlled. Resume amiodarone .    Procedures: Repair of incarcerated inguinal hernia performed on 11/30/2013   Consultations: General surgery  Pulmonary critical care medicine  ID   Discharge Exam: Filed Vitals:   12/05/13 0612  BP: 150/62  Pulse: 76  Temp: 98.7 F (37.1 C)  Resp: 20   General: Nontoxic, awake alert oriented times  Cardiovascular: Regular rate rhythm normal S1 S2  Respiratory: ctab Abdomen: Status post hernia repair  Musculoskeletal: No edema    Discharge Instructions  Discharge Orders   Future Orders Complete By Expires   Discharge instructions  As directed    Comments:     Follow up with PCP , surgery and cardiology as recommended.       Medication List    STOP taking these medications       bisacodyl 10 MG suppository  Commonly known as:  DULCOLAX     rivaroxaban 10 MG Tabs tablet  Commonly known as:  XARELTO      TAKE these medications       amiodarone 200 MG tablet  Commonly known as:  PACERONE  Take 200 mg by mouth at bedtime.     aspirin EC 81 MG tablet  Take 1 tablet (81 mg total) by mouth daily.     atorvastatin 40 MG tablet  Commonly known as:  LIPITOR  Take 40 mg by mouth at bedtime.     clopidogrel 75 MG tablet  Commonly known as:  PLAVIX  Take 1 tablet (75 mg total) by mouth daily with breakfast.     DSS 100 MG Caps  Take 100 mg by mouth 2 (two) times daily.     iron polysaccharides 150 MG capsule  Commonly known as:  NIFEREX  Take 1 capsule (150 mg total) by mouth 2 (two) times daily.     levofloxacin 500 MG tablet  Commonly known as:  LEVAQUIN  Take 1 tablet (500 mg total) by mouth daily.     lisinopril 5 MG tablet  Commonly known as:  PRINIVIL,ZESTRIL  Take 5 mg by mouth at bedtime.     methocarbamol 500 MG tablet  Commonly known as:  ROBAXIN  Take 1 tablet (500 mg total)  by mouth every 6 (six) hours as needed for muscle spasms.     metoCLOPramide 5 MG tablet  Commonly known as:  REGLAN  Take 1-2 tablets (5-10 mg total) by mouth every 8 (eight) hours as needed for nausea (if ondansetron (ZOFRAN) ineffective.).     ondansetron 4 MG tablet  Commonly known as:  ZOFRAN  Take 1 tablet (4 mg total) by mouth every 6 (six) hours as needed for nausea.     oxyCODONE 5 MG immediate release tablet  Commonly known as:  Oxy IR/ROXICODONE  Take 1-2 tablets (5-10 mg total) by mouth every 3 (three) hours as needed for breakthrough pain.     pantoprazole 40 MG tablet  Commonly known as:  PROTONIX  Take 40 mg by mouth daily. TAKES IN AM     polyethylene glycol packet  Commonly known as:  MIRALAX / GLYCOLAX  Take 17 g by mouth daily as needed for mild constipation.     sodium chloride 0.9 % SOLN 500 mL with vancomycin 10 G SOLR 1,500 mg  Inject 1,500 mg into the vein every  12 (twelve) hours.     tamsulosin 0.4 MG Caps capsule  Commonly known as:  FLOMAX  Take 0.4 mg by mouth daily after supper.     traMADol 50 MG tablet  Commonly known as:  ULTRAM  Take 1-2 tablets (50-100 mg total) by mouth every 6 (six) hours as needed (mild pain).     TYLENOL PO  Take by mouth. IF NEEDED 500 MG FOR PAIN     zolpidem 5 MG tablet  Commonly known as:  AMBIEN  Take 1 tablet (5 mg total) by mouth at bedtime as needed for sleep.       No Known Allergies     Follow-up Information   Follow up with NEWMAN,DAVID H, MD. Schedule an appointment as soon as possible for a visit in 2 weeks.   Specialty:  General Surgery   Contact information:   [address] [place] [address] [phone]       Follow up with Vernona Rieger, MD. Schedule an appointment as soon as possible for a visit in 1 week.   Specialty:  Internal Medicine      Follow up with Cliffton Asters, MD On 12/16/2013.   Specialty:  Infectious Diseases   Contact information:   [address] [place] [address] [phone]        The results of significant diagnostics from this hospitalization (including imaging, microbiology, ancillary and laboratory) are listed below for reference.    Significant Diagnostic Studies: Dg Chest 2 View  11/19/2013   CLINICAL DATA:  Coronary artery disease.  EXAM: CHEST  2 VIEW  COMPARISON:  None.  FINDINGS: The heart size and mediastinal contours are within normal limits. There is no focal infiltrate, pulmonary edema, or pleural effusion. There is scoliosis of spine and degenerative joint changes of spine. Chronic deformity of the right acromioclavicular joint is noted.  IMPRESSION: No active cardiopulmonary disease.   Electronically Signed   By: Sherian Rein M.D.   On: 11/19/2013 14:01   Dg Hip Complete Left  11/19/2013   CLINICAL DATA:  Preop for hip surgery.  EXAM: LEFT HIP - COMPLETE 2+ VIEW  COMPARISON:  None.  FINDINGS: There is no evidence of hip fracture or dislocation. There is marked narrow left hip joint space with chronic deformity of the left femoral head. Osteophytes are noted extending from the left acetabulum.  IMPRESSION: Chronic changes of left hip.  No acute fracture or dislocation.   Electronically Signed   By: Sherian Rein M.D.   On: 11/19/2013 13:56   Ct Angio Chest Pe W/cm &/or Wo Cm  11/30/2013   CLINICAL DATA:  Chest pain, constipation and decreased O2 saturation. History of smoking.  EXAM: CT ANGIOGRAPHY CHEST  CT ABDOMEN AND PELVIS WITH CONTRAST  TECHNIQUE: Multidetector CT imaging of the chest was performed using the standard protocol during bolus administration of intravenous contrast. Multiplanar CT image reconstructions including MIPs were obtained to evaluate the vascular anatomy. Multidetector CT imaging of the abdomen and pelvis was performed using the standard protocol during bolus administration of intravenous contrast.  CONTRAST:  OMNIPAQUE IOHEXOL 350 MG/ML SOLN  COMPARISON:  Chest and  abdominal radiographs performed 11/29/2013  FINDINGS: CTA CHEST FINDINGS  There is no evidence of pulmonary embolus. There is no evidence of aortic dissection. The thoracic aorta is unremarkable in appearance. The proximal great vessels are within normal limits.  Fluffy bilateral airspace opacification is noted in the lower lung lobes and to  a lesser extent within the posterior right upper lobe. Airspace opacification is most dense at the left lower lobe. Findings are compatible with multifocal pneumonia. There is no evidence of pleural effusion or pneumothorax. No masses are identified; no abnormal focal contrast enhancement is seen.  The mediastinum is unremarkable in appearance, aside diffuse coronary artery calcification. No mediastinal lymphadenopathy is seen. No pericardial effusion is identified.  Incidental note is made of diffuse distention of the esophagus, with dilatation of the distal esophagus with fluid. This may reflect esophageal dysmotility, with an associated small to moderate hiatal hernia. No axillary lymphadenopathy is seen. The thyroid gland is unremarkable in appearance.  No acute osseous abnormalities are seen. Healed right-sided rib fractures are noted. Mild degenerative change is noted at the lower cervical spine.  CT ABDOMEN and PELVIS FINDINGS  There is no evidence of aortic dissection. Scattered calcific atherosclerotic disease is noted along the abdominal aorta, without evidence of aneurysmal dilatation.  Scattered hypodensities are noted within the liver, measuring up to 2.5 cm in size. These are nonspecific, but may reflect cysts. The spleen is unremarkable in appearance. The gallbladder is within normal limits. The pancreas and adrenal glands are unremarkable.  Nonspecific perinephric stranding is noted bilaterally. The kidneys are otherwise unremarkable in appearance. Contrast is noted filling the renal calyces, limiting evaluation for renal stones. No obstructing ureteral stones  are seen. There is no evidence of hydronephrosis.  The colon is diffusely distended with fluid, with the cecum measuring up to 11.1 cm. There is gradual fecalization of the distal descending colon. This appears to reflect distal obstruction due to a small to moderate left inguinal hernia, containing a segment of mildly inflamed proximal sigmoid colon. Trace associated free fluid is seen. The small bowel is unremarkable in appearance. The stomach is within normal limits. No acute vascular abnormalities are seen.  The appendix is unremarkable in appearance; there is no evidence for appendicitis.  The bladder is mildly distended and grossly unremarkable the prostate remains normal in size, with scattered calcification. Postoperative calcifications are seen about the left hip, with associated left hip prosthesis. The prosthesis is incompletely imaged on this study. No inguinal lymphadenopathy is seen.  No acute osseous abnormalities are identified. Vacuum phenomenon is noted at L4-5.  Review of the MIP images confirms the above findings.  IMPRESSION: 1. Distal obstruction at the level of the proximal sigmoid colon, due to a small to moderate left inguinal hernia, containing a segment of mildly inflamed proximal sigmoid colon. Diffuse distention of the colon with fluid, measuring up to 11.1 cm at the cecum, with gradual fecalization of the distal descending colon. Manual reduction may be possible, though difficult. 2. Multifocal pneumonia noted, most prominent at the lower lung lobes, particularly on the left. 3. No evidence of pulmonary embolus. No evidence of aortic dissection. 4. Diffuse distention of the esophagus, filled with fluid, with an associated small to moderate hiatal hernia. This raises concern for chronic esophageal dysmotility. 5. Scattered calcific atherosclerotic disease noted along the abdominal aorta. 6. Scattered likely hepatic cysts seen.  These results were called by telephone at the time of  interpretation on 11/30/2013 at 4:18 AM to Dr. Blane Ohara, who verbally acknowledged these results.   Electronically Signed   By: Roanna Raider M.D.   On: 11/30/2013 04:32   Ct Abdomen Pelvis W Contrast  11/30/2013   CLINICAL DATA:  Chest pain, constipation and decreased O2 saturation. History of smoking.  EXAM: CT ANGIOGRAPHY CHEST  CT ABDOMEN AND  PELVIS WITH CONTRAST  TECHNIQUE: Multidetector CT imaging of the chest was performed using the standard protocol during bolus administration of intravenous contrast. Multiplanar CT image reconstructions including MIPs were obtained to evaluate the vascular anatomy. Multidetector CT imaging of the abdomen and pelvis was performed using the standard protocol during bolus administration of intravenous contrast.  CONTRAST:  OMNIPAQUE IOHEXOL 350 MG/ML SOLN  COMPARISON:  Chest and abdominal radiographs performed 11/29/2013  FINDINGS: CTA CHEST FINDINGS  There is no evidence of pulmonary embolus. There is no evidence of aortic dissection. The thoracic aorta is unremarkable in appearance. The proximal great vessels are within normal limits.  Fluffy bilateral airspace opacification is noted in the lower lung lobes and to a lesser extent within the posterior right upper lobe. Airspace opacification is most dense at the left lower lobe. Findings are compatible with multifocal pneumonia. There is no evidence of pleural effusion or pneumothorax. No masses are identified; no abnormal focal contrast enhancement is seen.  The mediastinum is unremarkable in appearance, aside diffuse coronary artery calcification. No mediastinal lymphadenopathy is seen. No pericardial effusion is identified.  Incidental note is made of diffuse distention of the esophagus, with dilatation of the distal esophagus with fluid. This may reflect esophageal dysmotility, with an associated small to moderate hiatal hernia. No axillary lymphadenopathy is seen. The thyroid gland is unremarkable in  appearance.  No acute osseous abnormalities are seen. Healed right-sided rib fractures are noted. Mild degenerative change is noted at the lower cervical spine.  CT ABDOMEN and PELVIS FINDINGS  There is no evidence of aortic dissection. Scattered calcific atherosclerotic disease is noted along the abdominal aorta, without evidence of aneurysmal dilatation.  Scattered hypodensities are noted within the liver, measuring up to 2.5 cm in size. These are nonspecific, but may reflect cysts. The spleen is unremarkable in appearance. The gallbladder is within normal limits. The pancreas and adrenal glands are unremarkable.  Nonspecific perinephric stranding is noted bilaterally. The kidneys are otherwise unremarkable in appearance. Contrast is noted filling the renal calyces, limiting evaluation for renal stones. No obstructing ureteral stones are seen. There is no evidence of hydronephrosis.  The colon is diffusely distended with fluid, with the cecum measuring up to 11.1 cm. There is gradual fecalization of the distal descending colon. This appears to reflect distal obstruction due to a small to moderate left inguinal hernia, containing a segment of mildly inflamed proximal sigmoid colon. Trace associated free fluid is seen. The small bowel is unremarkable in appearance. The stomach is within normal limits. No acute vascular abnormalities are seen.  The appendix is unremarkable in appearance; there is no evidence for appendicitis.  The bladder is mildly distended and grossly unremarkable the prostate remains normal in size, with scattered calcification. Postoperative calcifications are seen about the left hip, with associated left hip prosthesis. The prosthesis is incompletely imaged on this study. No inguinal lymphadenopathy is seen.  No acute osseous abnormalities are identified. Vacuum phenomenon is noted at L4-5.  Review of the MIP images confirms the above findings.  IMPRESSION: 1. Distal obstruction at the level of  the proximal sigmoid colon, due to a small to moderate left inguinal hernia, containing a segment of mildly inflamed proximal sigmoid colon. Diffuse distention of the colon with fluid, measuring up to 11.1 cm at the cecum, with gradual fecalization of the distal descending colon. Manual reduction may be possible, though difficult. 2. Multifocal pneumonia noted, most prominent at the lower lung lobes, particularly on the left. 3. No evidence  of pulmonary embolus. No evidence of aortic dissection. 4. Diffuse distention of the esophagus, filled with fluid, with an associated small to moderate hiatal hernia. This raises concern for chronic esophageal dysmotility. 5. Scattered calcific atherosclerotic disease noted along the abdominal aorta. 6. Scattered likely hepatic cysts seen.  These results were called by telephone at the time of interpretation on 11/30/2013 at 4:18 AM to Dr. Blane Ohara, who verbally acknowledged these results.   Electronically Signed   By: Roanna Raider M.D.   On: 11/30/2013 04:32   Dg Pelvis Portable  11/25/2013   CLINICAL DATA:  Left hip replacement.  EXAM: PORTABLE PELVIS 1-2 VIEWS  COMPARISON:  Left hip series 12 10/2013.  FINDINGS: Patient status post total left hip replacement. Good anatomic alignment noted on one view. Surgical drainage catheter noted over the left hip. Stable calcific densities noted adjacent to the left hip. These could represent loose bodies. No acute bony abnormality.  IMPRESSION: Patient status post total left hip replacement with good anatomic alignment.   Electronically Signed   By: Maisie Fus  Register   On: 11/25/2013 14:17   Dg Chest Port 1 View  12/02/2013   CLINICAL DATA:  Pneumonia  EXAM: PORTABLE CHEST - 1 VIEW  COMPARISON:  Yesterday  FINDINGS: Stable right basilar subsegmental atelectasis. Improved left basilar patchy airspace disease. No pneumothorax. Normal heart size. NG tube removed.  IMPRESSION: Improved left basilar patchy consolidation.    Electronically Signed   By: Maryclare Bean M.D.   On: 12/02/2013 07:49   Dg Chest Port 1 View  12/01/2013   CLINICAL DATA:  Pneumonia.  EXAM: PORTABLE CHEST - 1 VIEW  COMPARISON:  Chest CT 11/30/2013.  Chest x-ray 11/19/2013.  FINDINGS: NG tube present with tip below the left hemidiaphragm. Mediastinum and hilar structures normal. Patchy bilateral pulmonary infiltrates are noted particularly in the left lower lobe. Left lower lobe infiltrate is prominent. No evidence of pneumothorax. Heart size and pulmonary vascularity normal. No acute osseous abnormality.  IMPRESSION: 1. Bilateral pulmonary infiltrates particularly in the left lower lobe. Left lower lobe infiltrate is prominent. Findings consistent with pneumonia. 2. NG tube noted with tip below left hemidiaphragm.   Electronically Signed   By: Maisie Fus  Register   On: 12/01/2013 07:09   Dg Abd Acute W/chest  11/29/2013   CLINICAL DATA:  Constipation and vomiting. Recent left total hip arthroplasty.  EXAM: ACUTE ABDOMEN SERIES (ABDOMEN 2 VIEW & CHEST 1 VIEW)  COMPARISON:  Pelvis radiograph performed 11/25/2013  FINDINGS: The lungs are relatively well-aerated and clear. There is no evidence of focal opacification, pleural effusion or pneumothorax. The cardiomediastinal silhouette is within normal limits.  The visualized bowel gas pattern is nonspecific. Scattered fluid and air are seen within the small bowel, with a few small air-fluid levels, and scattered stool is noted within the colon; there is no evidence of small bowel dilatation to suggest obstruction. No free intra-abdominal air is identified on the provided decubitus view.  There is suggestion of increased lucency about the socket of the patient's left hip arthroplasty. If the patient has associated significant symptoms, dedicated left hip radiographs could be considered for further evaluation.  A few tiny stones are noted at the interpole region of the left kidney.  IMPRESSION: 1. Nonspecific bowel gas  pattern; no evidence of bowel dilatation to suggest obstruction, though a few small air-fluid levels are seen. No free intra-abdominal air identified. 2. No acute cardiopulmonary process seen. 3. Suggestion of increased lucency about the site of the  patient's left hip arthroplasty. If the patient has significant associated left hip symptoms, dedicated left hip radiographs could be considered for further evaluation. 4. Tiny left renal stones incidentally noted.   Electronically Signed   By: Roanna Raider M.D.   On: 11/29/2013 23:50   Dg Abd Portable 1v  11/30/2013   CLINICAL DATA:  Nasogastric tube placement  EXAM: PORTABLE ABDOMEN - 1 VIEW  COMPARISON:  See CT abdomen and pelvis November 30, 2013  FINDINGS: Nasogastric tube tip and side port are in the proximal stomach ; the side port is just below the gastroesophageal junction. Most bowel loops are fluid-filled as is noted on CT obtained earlier in the day. No free air is seen on this examination. There is atelectasis in the left lung base.  IMPRESSION: Nasogastric tube tip and side port in proximal stomach. Most bowel loops appear fluid-filled.   Electronically Signed   By: Bretta Bang M.D.   On: 11/30/2013 06:58   Dg C-arm 61-120 Min-no Report  11/25/2013   CLINICAL DATA: laft anterior hip   C-ARM 61-120 MINUTES  Fluoroscopy was utilized by the requesting physician.  No radiographic  interpretation.     Microbiology: Recent Results (from the past 240 hour(s))  CULTURE, BLOOD (ROUTINE X 2)     Status: None   Collection Time    11/30/13  4:53 AM      Result Value Range Status   Specimen Description BLOOD RIGHT HAND   Final   Special Requests BOTTLES DRAWN AEROBIC ONLY 3CC   Final   Culture  Setup Time     Final   Value: 11/30/2013 09:21     Performed at [facility]   Culture     Final   Value: ENTEROCOCCUS SPECIES     Note: COMBINATION THERAPY OF HIGH DOSE AMPICILLIN OR VANCOMYCIN, PLUS AN AMINOGLYCOSIDE, IS USUALLY INDICATED  FOR SERIOUS ENTEROCOCCAL INFECTIONS.     Note: Gram Stain Report Called to,Read Back By and Verified With: Max Sane ON 12/01/2013 AT 12:27A BY Serafina Mitchell     Performed at [facility]   Report Status 12/03/2013 FINAL   Final   Organism ID, Bacteria ENTEROCOCCUS SPECIES   Final  CULTURE, BLOOD (ROUTINE X 2)     Status: None   Collection Time    11/30/13  5:13 AM      Result Value Range Status   Specimen Description BLOOD LEFT HAND   Final   Special Requests BOTTLES DRAWN AEROBIC AND ANAEROBIC 3CC   Final   Culture  Setup Time     Final   Value: 11/30/2013 09:20     Performed at [facility]   Culture     Final   Value: ENTEROCOCCUS SPECIES     Note: SUSCEPTIBILITIES PERFORMED ON PREVIOUS CULTURE WITHIN THE LAST 5 DAYS.     Note: Gram Stain Report Called to,Read Back By and Verified With: Max Sane ON 12/01/2013 AT 12:27A BY Serafina Mitchell     Performed at [facility]   Report Status 12/03/2013 FINAL   Final  CULTURE, BLOOD (ROUTINE X 2)     Status: None   Collection Time    12/01/13  7:15 PM      Result Value Range Status   Specimen Description BLOOD RIGHT ARM   Final   Special Requests BOTTLES DRAWN AEROBIC ONLY [facility]   Final   Culture  Setup Time     Final   Value: 12/02/2013 00:52  Performed at [facility]     Final   Value:        BLOOD CULTURE RECEIVED NO GROWTH TO DATE CULTURE WILL BE HELD FOR 5 DAYS BEFORE ISSUING A FINAL NEGATIVE REPORT     Performed at [facility]   Report Status PENDING   Incomplete  CULTURE, BLOOD (ROUTINE X 2)     Status: None   Collection Time    12/01/13  7:35 PM      Result Value Range Status   Specimen Description BLOOD RIGHT HAND   Final   Special Requests BOTTLES DRAWN AEROBIC ONLY 10CC   Final   Culture  Setup Time     Final   Value: 12/02/2013 00:52     Performed at [facility]   Culture     Final   Value:        BLOOD CULTURE RECEIVED NO GROWTH TO DATE CULTURE WILL BE HELD  FOR 5 DAYS BEFORE ISSUING A FINAL NEGATIVE REPORT     Performed at [facility]   Report Status PENDING   Incomplete  CULTURE, BLOOD (ROUTINE X 2)     Status: None   Collection Time    12/02/13  2:19 PM      Result Value Range Status   Specimen Description BLOOD RIGHT ARM   Final   Special Requests BOTTLES DRAWN AEROBIC AND ANAEROBIC 5CC   Final   Culture  Setup Time     Final   Value: 12/02/2013 20:53     Performed at [facility]   Culture     Final   Value:        BLOOD CULTURE RECEIVED NO GROWTH TO DATE CULTURE WILL BE HELD FOR 5 DAYS BEFORE ISSUING A FINAL NEGATIVE REPORT     Performed at [facility]   Report Status PENDING   Incomplete  CULTURE, BLOOD (ROUTINE X 2)     Status: None   Collection Time    12/02/13  2:29 PM      Result Value Range Status   Specimen Description BLOOD LEFT HAND   Final   Special Requests BOTTLES DRAWN AEROBIC ONLY 1CC   Final   Culture  Setup Time     Final   Value: 12/02/2013 20:55     Performed at [facility]   Culture     Final   Value:        BLOOD CULTURE RECEIVED NO GROWTH TO DATE CULTURE WILL BE HELD FOR 5 DAYS BEFORE ISSUING A FINAL NEGATIVE REPORT     Performed at [facility]   Report Status PENDING   Incomplete     Labs: Basic Metabolic Panel:  Recent Labs Lab 11/29/13 2325 11/30/13 0906 12/01/13 0340 12/02/13 0520 12/05/13 0455  NA 135 137 142 136 137  K 3.5 4.3 4.5 3.8 3.6  CL 93* 94* 102 99 104  CO2 32 30 35* 29 23  GLUCOSE 188* 180* 122* 109* 114*  BUN 26* 33* 39* 29* 16  CREATININE 1.00 1.35 1.28 1.11 1.01  CALCIUM 9.2 8.8 7.8* 7.6* 8.6  MG  --   --  4.6*  --   --   PHOS  --   --  5.0*  --   --    Liver Function Tests:  Recent Labs Lab 11/30/13 0906  AST 43*  ALT 37  ALKPHOS 61  BILITOT 0.9  PROT 6.2  ALBUMIN  2.9*   No results found for this basename: LIPASE, AMYLASE,  in the last 168 hours No results found for this basename: AMMONIA,  in the last 168  hours CBC:  Recent Labs Lab 11/29/13 2325 11/30/13 1012 12/01/13 0340 12/02/13 0520 12/05/13 0455  WBC 9.9 5.7 6.3 6.4 7.8  NEUTROABS 8.6* 5.0  --   --   --   HGB 11.2* 11.9* 9.7* 9.3* 9.1*  HCT 32.9* 36.4* 29.8* 28.5* 28.1*  MCV 90.9 91.9 93.1 94.4 92.4  PLT 227 214 213 209 290   Cardiac Enzymes: No results found for this basename: CKTOTAL, CKMB, CKMBINDEX, TROPONINI,  in the last 168 hours BNP: BNP (last 3 results) No results found for this basename: PROBNP,  in the last 8760 hours CBG:  Recent Labs Lab 11/30/13 1617 11/30/13 2015 11/30/13 2349 12/01/13 0345 12/01/13 0927  GLUCAP 136* 124* 119* 105* 117*       Signed:  Jaquell Seddon  [facility] 12/05/2013, 3:08 PM

## 2013-12-05 NOTE — Progress Notes (Signed)
Patient ID: Barry Morgan, male   DOB: December 20, 1940, 72 y.o.   MRN: [id number]         [facility]    Date of Admission:  11/29/2013   Total days of antibiotics 5         Principal Problem:   Incarcerated inguinal hernia Active Problems:   HCAP (healthcare-associated pneumonia)   Enterococcal bacteremia   History of total hip arthroplasty   Postoperative anemia due to acute blood loss   CAD (coronary artery disease)   HLD (hyperlipidemia)   Acute respiratory failure   Hyperglycemia   GERD (gastroesophageal reflux disease)   Ex-cigarette smoker   . heparin subcutaneous  5,000 Units Subcutaneous Q8H  . levalbuterol  0.63 mg Nebulization Q6H WA  . levofloxacin  500 mg Oral Daily  . pantoprazole (PROTONIX) IV  40 mg Intravenous Q24H  . sodium chloride  10-40 mL Intracatheter Q12H  . sodium chloride  3 mL Intravenous Q12H  . vancomycin  1,250 mg Intravenous Q12H    Subjective: He is feeling better. He states that he only has minimal cough now no shortness of breath. He has some stiffness in his left hip but no pain. He is bothered by scrotal swelling.   Past Medical History  Diagnosis Date  . Hyperlipidemia   . Eye globe prosthesis     RIGHT  . GERD (gastroesophageal reflux disease)   . Hypertension   . Dysrhythmia   . BPH (benign prostatic hypertrophy)   . PONV (postoperative nausea and vomiting)     SICK FOR 3 DAYS AFTER COLONOSCOPY  . Coronary artery disease     DRUG ELUTING STENT DISTAL RCA 04/2012  . Shortness of breath     ONLY IF CLIMBING 2 OR 3 FLIGHTS OF STAIRS  . Arthritis     PAIN AND OA LEFT HIP  . Incarcerated inguinal hernia 12/02/2013    Incarcerated left inguinal hernia repaired 11/30/13 with mesh     History  Substance Use Topics  . Smoking status: Former Smoker    Types: Cigars  . Smokeless tobacco: Never Used  . Alcohol Use: Yes     Comment: QUIT SMOKING 15 YRS AGO   COUPLE OF GLASSES WINE DURING WEEK    History  reviewed. No pertinent family history.  No Known Allergies  Objective: Temp:  [97.9 F (36.6 C)-98.7 F (37.1 C)] 98.7 F (37.1 C) (12/27 0612) Pulse Rate:  [76-94] 76 (12/27 0612) Resp:  [18-20] 20 (12/27 0612) BP: (150-162)/(62-76) 150/62 mmHg (12/27 0612) SpO2:  [95 %-97 %] 97 % (12/27 0930)  General: He is alert and in no distress Skin: No rash Lungs: Clear Cor: Regular S1 and S2 with no murmurs Abdomen: Still distended but not as much. Soft and nontender. Left inguinal incision healing nicely Left hip: No drainage on gauze dressing that has been in place for over 24 hours  Lab Results Lab Results  Component Value Date   WBC 7.8 12/05/2013   HGB 9.1* 12/05/2013   HCT 28.1* 12/05/2013   MCV 92.4 12/05/2013   PLT 290 12/05/2013    Lab Results  Component Value Date   CREATININE 1.01 12/05/2013   BUN 16 12/05/2013   NA 137 12/05/2013   K 3.6 12/05/2013   CL 104 12/05/2013   CO2 23 12/05/2013    Lab Results  Component Value Date   ALT 37 11/30/2013   AST 43* 11/30/2013   ALKPHOS 61 11/30/2013   BILITOT 0.9 11/30/2013  Microbiology: Recent Results (from the past 240 hour(s))  CULTURE, BLOOD (ROUTINE X 2)     Status: None   Collection Time    11/30/13  4:53 AM      Result Value Range Status   Specimen Description BLOOD RIGHT HAND   Final   Special Requests BOTTLES DRAWN AEROBIC ONLY 3CC   Final   Culture  Setup Time     Final   Value: 11/30/2013 09:21     Performed at [facility]   Culture     Final   Value: ENTEROCOCCUS SPECIES     Note: COMBINATION THERAPY OF HIGH DOSE AMPICILLIN OR VANCOMYCIN, PLUS AN AMINOGLYCOSIDE, IS USUALLY INDICATED FOR SERIOUS ENTEROCOCCAL INFECTIONS.     Note: Gram Stain Report Called to,Read Back By and Verified With: Max Sane ON 12/01/2013 AT 12:27A BY Serafina Mitchell     Performed at [facility]   Report Status 12/03/2013 FINAL   Final   Organism ID, Bacteria ENTEROCOCCUS SPECIES   Final  CULTURE, BLOOD  (ROUTINE X 2)     Status: None   Collection Time    11/30/13  5:13 AM      Result Value Range Status   Specimen Description BLOOD LEFT HAND   Final   Special Requests BOTTLES DRAWN AEROBIC AND ANAEROBIC 3CC   Final   Culture  Setup Time     Final   Value: 11/30/2013 09:20     Performed at [facility]   Culture     Final   Value: ENTEROCOCCUS SPECIES     Note: SUSCEPTIBILITIES PERFORMED ON PREVIOUS CULTURE WITHIN THE LAST 5 DAYS.     Note: Gram Stain Report Called to,Read Back By and Verified With: Max Sane ON 12/01/2013 AT 12:27A BY Serafina Mitchell     Performed at [facility]   Report Status 12/03/2013 FINAL   Final  CULTURE, BLOOD (ROUTINE X 2)     Status: None   Collection Time    12/01/13  7:15 PM      Result Value Range Status   Specimen Description BLOOD RIGHT ARM   Final   Special Requests BOTTLES DRAWN AEROBIC ONLY [facility]   Final   Culture  Setup Time     Final   Value: 12/02/2013 00:52     Performed at [facility]   Culture     Final   Value:        BLOOD CULTURE RECEIVED NO GROWTH TO DATE CULTURE WILL BE HELD FOR 5 DAYS BEFORE ISSUING A FINAL NEGATIVE REPORT     Performed at [facility]   Report Status PENDING   Incomplete  CULTURE, BLOOD (ROUTINE X 2)     Status: None   Collection Time    12/01/13  7:35 PM      Result Value Range Status   Specimen Description BLOOD RIGHT HAND   Final   Special Requests BOTTLES DRAWN AEROBIC ONLY 10CC   Final   Culture  Setup Time     Final   Value: 12/02/2013 00:52     Performed at [facility]   Culture     Final   Value:        BLOOD CULTURE RECEIVED NO GROWTH TO DATE CULTURE WILL BE HELD FOR 5 DAYS BEFORE ISSUING A FINAL NEGATIVE REPORT     Performed at [facility]   Report Status PENDING   Incomplete  CULTURE, BLOOD (ROUTINE X 2)  Status: None   Collection Time    12/02/13  2:19 PM      Result Value Range Status   Specimen Description BLOOD RIGHT ARM   Final   Special  Requests BOTTLES DRAWN AEROBIC AND ANAEROBIC 5CC   Final   Culture  Setup Time     Final   Value: 12/02/2013 20:53     Performed at [facility]   Culture     Final   Value:        BLOOD CULTURE RECEIVED NO GROWTH TO DATE CULTURE WILL BE HELD FOR 5 DAYS BEFORE ISSUING A FINAL NEGATIVE REPORT     Performed at [facility]   Report Status PENDING   Incomplete  CULTURE, BLOOD (ROUTINE X 2)     Status: None   Collection Time    12/02/13  2:29 PM      Result Value Range Status   Specimen Description BLOOD LEFT HAND   Final   Special Requests BOTTLES DRAWN AEROBIC ONLY 1CC   Final   Culture  Setup Time     Final   Value: 12/02/2013 20:55     Performed at [facility]   Culture     Final   Value:        BLOOD CULTURE RECEIVED NO GROWTH TO DATE CULTURE WILL BE HELD FOR 5 DAYS BEFORE ISSUING A FINAL NEGATIVE REPORT     Performed at [facility]   Report Status PENDING   Incomplete    Assessment: He is improving on therapy for HCAP and enterococcal bacteremia.  Plan: 1. Continue IV vancomycin through January 7 2. Continue oral levofloxacin for 3 more days 3. I will arrange follow up in my clinic on January 7  Cliffton Asters, MD [facility] for Infectious Disease [facility] [phone] pager   [phone] cell 12/05/2013, 11:20 AM

## 2013-12-05 NOTE — Progress Notes (Signed)
ANTIBIOTIC CONSULT NOTE - FOLLOW UP  Pharmacy Consult for vancomycin Indication: pneumonia, enterococcal bacteremia  No Known Allergies  Patient Measurements: Height: 5' 10.8" (179.8 cm) Weight: 232 lb 12.9 oz (105.6 kg) IBW/kg (Calculated) : 74.84   Vital Signs: Temp: 98.7 F (37.1 C) (12/27 0612) Temp src: Oral (12/27 0612) BP: 150/62 mmHg (12/27 0612) Pulse Rate: 76 (12/27 0612) Intake/Output from previous day: 12/26 0701 - 12/27 0700 In: 3513.3 [P.O.:840; I.V.:1823.3; IV Piggyback:850] Out: -  Intake/Output from this shift: Total I/O In: 240 [P.O.:240] Out: -   Labs:  Recent Labs  12/05/13 0455  WBC 7.8  HGB 9.1*  PLT 290  CREATININE 1.01   Estimated Creatinine Clearance: 81.4 ml/min (by C-G formula based on Cr of 1.01).  Recent Labs  12/05/13 1330  VANCOTROUGH 13.3     Microbiology: Recent Results (from the past 720 hour(s))  SURGICAL PCR SCREEN     Status: None   Collection Time    11/19/13 10:28 AM      Result Value Range Status   MRSA, PCR NEGATIVE  NEGATIVE Final   Staphylococcus aureus NEGATIVE  NEGATIVE Final   Comment:            The Xpert SA Assay (FDA     approved for NASAL specimens     in patients over 35 years of age),     is one component of     a comprehensive surveillance     program.  Test performance has     been validated by [employer] for patients greater     than or equal to 4 year old.     It is not intended     to diagnose infection nor to     guide or monitor treatment.  CULTURE, BLOOD (ROUTINE X 2)     Status: None   Collection Time    11/30/13  4:53 AM      Result Value Range Status   Specimen Description BLOOD RIGHT HAND   Final   Special Requests BOTTLES DRAWN AEROBIC ONLY 3CC   Final   Culture  Setup Time     Final   Value: 11/30/2013 09:21     Performed at [facility]   Culture     Final   Value: ENTEROCOCCUS SPECIES     Note: COMBINATION THERAPY OF HIGH DOSE AMPICILLIN OR VANCOMYCIN, PLUS  AN AMINOGLYCOSIDE, IS USUALLY INDICATED FOR SERIOUS ENTEROCOCCAL INFECTIONS.     Note: Gram Stain Report Called to,Read Back By and Verified With: Max Sane ON 12/01/2013 AT 12:27A BY Serafina Mitchell     Performed at [facility]   Report Status 12/03/2013 FINAL   Final   Organism ID, Bacteria ENTEROCOCCUS SPECIES   Final  CULTURE, BLOOD (ROUTINE X 2)     Status: None   Collection Time    11/30/13  5:13 AM      Result Value Range Status   Specimen Description BLOOD LEFT HAND   Final   Special Requests BOTTLES DRAWN AEROBIC AND ANAEROBIC 3CC   Final   Culture  Setup Time     Final   Value: 11/30/2013 09:20     Performed at [facility]   Culture     Final   Value: ENTEROCOCCUS SPECIES     Note: SUSCEPTIBILITIES PERFORMED ON PREVIOUS CULTURE WITHIN THE LAST 5 DAYS.     Note: Gram Stain Report Called to,Read Back By and Verified With: [facility]  BAR[place]AM ON 12/01/2013 AT 12:27A BY Serafina Mitchell     Performed at [facility]   Report Status 12/03/2013 FINAL   Final  CULTURE, BLOOD (ROUTINE X 2)     Status: None   Collection Time    12/01/13  7:15 PM      Result Value Range Status   Specimen Description BLOOD RIGHT ARM   Final   Special Requests BOTTLES DRAWN AEROBIC ONLY 6CC   Final   Culture  Setup Time     Final   Value: 12/02/2013 00:52     Performed at [facility]   Culture     Final   Value:        BLOOD CULTURE RECEIVED NO GROWTH TO DATE CULTURE WILL BE HELD FOR 5 DAYS BEFORE ISSUING A FINAL NEGATIVE REPORT     Performed at [facility]   Report Status PENDING   Incomplete  CULTURE, BLOOD (ROUTINE X 2)     Status: None   Collection Time    12/01/13  7:35 PM      Result Value Range Status   Specimen Description BLOOD RIGHT HAND   Final   Special Requests BOTTLES DRAWN AEROBIC ONLY 10CC   Final   Culture  Setup Time     Final   Value: 12/02/2013 00:52     Performed at [facility]   Culture     Final   Value:        BLOOD CULTURE RECEIVED  NO GROWTH TO DATE CULTURE WILL BE HELD FOR 5 DAYS BEFORE ISSUING A FINAL NEGATIVE REPORT     Performed at [facility]   Report Status PENDING   Incomplete  CULTURE, BLOOD (ROUTINE X 2)     Status: None   Collection Time    12/02/13  2:19 PM      Result Value Range Status   Specimen Description BLOOD RIGHT ARM   Final   Special Requests BOTTLES DRAWN AEROBIC AND ANAEROBIC 5CC   Final   Culture  Setup Time     Final   Value: 12/02/2013 20:53     Performed at [facility]   Culture     Final   Value:        BLOOD CULTURE RECEIVED NO GROWTH TO DATE CULTURE WILL BE HELD FOR 5 DAYS BEFORE ISSUING A FINAL NEGATIVE REPORT     Performed at [facility]   Report Status PENDING   Incomplete  CULTURE, BLOOD (ROUTINE X 2)     Status: None   Collection Time    12/02/13  2:29 PM      Result Value Range Status   Specimen Description BLOOD LEFT HAND   Final   Special Requests BOTTLES DRAWN AEROBIC ONLY 1CC   Final   Culture  Setup Time     Final   Value: 12/02/2013 20:55     Performed at [facility]   Culture     Final   Value:        BLOOD CULTURE RECEIVED NO GROWTH TO DATE CULTURE WILL BE HELD FOR 5 DAYS BEFORE ISSUING A FINAL NEGATIVE REPORT     Performed at [facility]   Report Status PENDING   Incomplete    Anti-infectives   Start     Dose/Rate Route Frequency Ordered Stop   12/05/13 1500  vancomycin (VANCOCIN) 1,500 mg in sodium chloride 0.9 % 500 mL IVPB  1,500 mg 250 mL/hr over 120 Minutes Intravenous Every 12 hours 12/05/13 1439     12/05/13 0000  levofloxacin (LEVAQUIN) 500 MG tablet     500 mg Oral Daily 12/05/13 1323     12/05/13 0000  sodium chloride 0.9 % SOLN 250 mL with vancomycin 10 G SOLR 1,250 mg     1,250 mg 166.7 mL/hr over 90 Minutes Intravenous Every 12 hours 12/05/13 1323 12/16/13 2359   12/04/13 1200  levofloxacin (LEVAQUIN) tablet 500 mg     500 mg Oral Daily 12/04/13 1101     11/30/13 2200  vancomycin (VANCOCIN) 1,250  mg in sodium chloride 0.9 % 250 mL IVPB  Status:  Discontinued     1,250 mg 166.7 mL/hr over 90 Minutes Intravenous Every 12 hours 11/30/13 0831 12/05/13 1438   11/30/13 1400  piperacillin-tazobactam (ZOSYN) IVPB 3.375 g  Status:  Discontinued     3.375 g 12.5 mL/hr over 240 Minutes Intravenous Every 8 hours 11/30/13 0831 12/04/13 1101   11/30/13 0500  vancomycin (VANCOCIN) 1,500 mg in sodium chloride 0.9 % 500 mL IVPB     1,500 mg 250 mL/hr over 120 Minutes Intravenous  Once 11/30/13 0433 11/30/13 0856   11/30/13 0445  piperacillin-tazobactam (ZOSYN) IVPB 3.375 g     3.375 g 100 mL/hr over 30 Minutes Intravenous  Once 11/30/13 1610 11/30/13 0726      Assessment: 72 y/o M s/p L THA 12/17, readmitted with sigmoid colon obstruction secondary to an incarcerated inguinal hernia, which was repaired on 12/22.  CXR showed pneumonia (?aspiration vs HCAP), and empiric Zosyn + vancomycin was started.  Now on D#6 of 14  Vancomycin 1250 mg IV q12h  2 of 2 blood cultures from 12/22 growing Enterococcus.  Tmax 99.6  WBC remain within normal limits  Vancomycin trough slightly subtherapeutic (13.3 mcg/ml) after 8 doses therefore this is true steady state  Goal of Therapy:  Eradication of infection Adjust Zosyn and vancomycin dosages for renal function and weight Vancomycin trough 15-20  Plan:   Increase Vancomycin to 1500mg  IV q12h  For discharge, recommend rechecking vancomycin trough and serum creatinine by Wed 12/31, then weekly for the duration of therapy  Loralee Pacas, PharmD, BCPS Pager: [phone] 12/05/2013  2:40 PM

## 2013-12-05 NOTE — Progress Notes (Signed)
Patient ID: Barry Morgan, male   DOB: Jan 06, 1941, 72 y.o.   MRN: [id number]  General Surgery - [facility] - Progress Note  POD# 5  Subjective: Patient up in chair.  Tolerating regular diet.  Ambulating in halls.  Wants to go home today.  Objective: Vital signs in last 24 hours: Temp:  [97.9 F (36.6 C)-98.7 F (37.1 C)] 98.7 F (37.1 C) (12/27 0612) Pulse Rate:  [76-94] 76 (12/27 0612) Resp:  [18-20] 20 (12/27 0612) BP: (150-162)/(62-76) 150/62 mmHg (12/27 0612) SpO2:  [95 %-97 %] 97 % (12/27 0612) Last BM Date: 12/04/13  Intake/Output from previous day: 12/26 0701 - 12/27 0700 In: 3513.3 [P.O.:840; I.V.:1823.3; IV Piggyback:850] Out: -   Exam: HEENT - clear, not icteric Neck - soft Chest - clear bilaterally Cor - RRR, no murmur Abd - soft with mild distension; BS present; left inguinal wound clear and dry GU - mild scrotal edema Ext - no significant edema Neuro - grossly intact, no focal deficits  Lab Results:   Recent Labs  12/05/13 0455  WBC 7.8  HGB 9.1*  HCT 28.1*  PLT 290     Recent Labs  12/05/13 0455  NA 137  K 3.6  CL 104  CO2 23  GLUCOSE 114*  BUN 16  CREATININE 1.01  CALCIUM 8.6    Studies/Results: No results found.  Assessment / Plan: 1.  Status post [facility] repair for colonic obstruction  On regular diet  Having BM's and passing flatus  Discussed scrotal edema and management  OK for discharge home today from surgical standpoint  Will arrange follow up in [facility] office with Dr. Ezzard Standing in 2 weeks  Velora Heckler, MD, [facility] Office: [phone]  12/05/2013

## 2013-12-05 NOTE — Progress Notes (Addendum)
   CARE MANAGEMENT NOTE 12/06/2013  Patient:  Barry Morgan, Barry Morgan   Account Number:  [id number]  Date Initiated:  12/01/2013  Documentation initiated by:  DAVIS,RHONDA  Subjective/Objective Assessment:   pt with recent history of orif and now present with incarerate hernia and sbo     Action/Plan:   transfered to sdu due to hypoxia and requiring  o2 at 40% via nrb and then [place]  from assisted living at [facility]   Anticipated DC Date:  12/06/2013   Anticipated DC Plan:  HOME W HOME HEALTH SERVICES  In-house referral  Clinical Social Worker      DC Planning Services  CM consult      [facility] Choice  NA   Choice offered to / List presented to:  C-1 Patient   DME arranged  NA      DME agency  NA     HH arranged  HH-1 RN  IV Antibiotics  HH-2 PT      HH agency  [facility]   Status of service:  Completed, signed off Medicare Important Message given?  NA - LOS <3 / Initial given by admissions (If response is "NO", the following Medicare IM given date fields will be blank) Date Medicare IM given:   Date Additional Medicare IM given:    Discharge Disposition:  HOME W HOME HEALTH SERVICES  Per UR Regulation:  Reviewed for med. necessity/level of care/duration of stay  If discussed at Long Length of Stay Meetings, dates discussed:    Comments:  12/05/2013 2:34 PM   Spoke to [facility]. Requested soc of 12/06/2013. NCM spoke to pt and agreeable to [facility] RN visit in am to start Home IV abx. Pt will receive his evening abx before dc. Faxed IV abx RX to [facility]. [facility] contact info added to dc instructions. Verified address and phone number are correct. Isidoro Donning RN CCM Case Mgmt phone [phone]   12/04/13 KATHY MAHABIR RN,BSN NCM [phone] EXPLAINED IN DETAIL TO PATIENT DIFFERENCE BETWEEN HHC-INTERMITTENT,& SNF-STRUCTURED REHAB.PT NOW RECOMMENDS-HH.[facility] CHOSEN FOR HH.TC KRISTEN AWARE OF REFERRAL-HOME IV ABX-WILL TELL PATIENT ABOUT HIS CO PAY FOR IV VANC.HHRN-LONG TERM IV  ABX,HHPT-EXERCISES.PATIENT VOICED UNDERSTANDING.AWAIT FINAL HH ORDERS.  [id number] [name], RN, BSN, [place] [phone] Chart Reviewed for discharge and hospital needs. Discharge needs at time of review:  None present will follow for needs. Review of patient progress due on [id number].

## 2013-12-07 ENCOUNTER — Telehealth (INDEPENDENT_AMBULATORY_CARE_PROVIDER_SITE_OTHER): Payer: Self-pay

## 2013-12-07 NOTE — Telephone Encounter (Signed)
P/O appt with DR. Newman 12-16-13 @ 915a patient aware

## 2013-12-07 NOTE — Progress Notes (Signed)
Discharge summary sent to payer through [facility]  

## 2013-12-08 LAB — CULTURE, BLOOD (ROUTINE X 2)
Culture: NO GROWTH
Culture: NO GROWTH

## 2013-12-09 ENCOUNTER — Telehealth (INDEPENDENT_AMBULATORY_CARE_PROVIDER_SITE_OTHER): Payer: Self-pay | Admitting: *Deleted

## 2013-12-09 NOTE — Telephone Encounter (Signed)
Patient called to report that he is having some swelling at the incision site with tenderness to touch and spots of blood on his underwear.  Patient asking why this would be happening 9 days out from surgery.  I explained to patient that the more he is up doing things the more swelling he may see in the area.  Explained the spots of blood are probably normal due to increased swelling putting pressure on the incision site.  Encouraged patient to recline, use ice, and Ibuprofen 800mg  q8hr per protocol to try to help.  Explained to keep an eye on the area over the next couple days to see if there is improvement.  Patient states understanding and agreeable at this time.

## 2013-12-14 ENCOUNTER — Telehealth (INDEPENDENT_AMBULATORY_CARE_PROVIDER_SITE_OTHER): Payer: Self-pay | Admitting: General Surgery

## 2013-12-14 NOTE — Telephone Encounter (Signed)
Marcelino DusterMichelle, nurse with [facility], in the pt's home for IV Vancomycin.  She called to report the surgical site appears very swollen, but not signs of infection.  Pt is afebrile.  Confirmed he has appt on Wed (12/16/13) and Dr. Ezzard StandingNewman will examine him then.  The repair for an emergency incarcerated hernia, so recommended continued use of ice and ibuprofen, as previously discussed.  They understand.

## 2013-12-15 ENCOUNTER — Telehealth: Payer: Self-pay | Admitting: *Deleted

## 2013-12-15 NOTE — Telephone Encounter (Signed)
V/M F/U call to ask how patient is doing today, [facility]

## 2013-12-15 NOTE — Telephone Encounter (Signed)
I will address this issue during his visit tomorrow.

## 2013-12-15 NOTE — Telephone Encounter (Addendum)
Patient states incision site remains swollen  Denies redness, drainage  temp and not painful. Patient has appointment 12-16-13 @ 915 Advised to continue with ice, and if condition changes to please call. Patient verbalized understanding

## 2013-12-15 NOTE — Telephone Encounter (Signed)
Patient unhappy that he is not getting nursing care BID for IV medication administration.  Per [facility], patient may not be compliant with BID dosing for very long, would like to inquire about stop date (they have 2/19 as a stop date for BID vancomycin, ID consult note has 1/7) or possibly change in therapy.  [facility] at [facility]:30 pm.     [facility], patient also reporting bulge at his hernia site.  Pt is following up with surgery 1/7 at 9:45am.

## 2013-12-16 ENCOUNTER — Ambulatory Visit (INDEPENDENT_AMBULATORY_CARE_PROVIDER_SITE_OTHER): Payer: Medicare Other | Admitting: Surgery

## 2013-12-16 ENCOUNTER — Ambulatory Visit
Admission: RE | Admit: 2013-12-16 | Discharge: 2013-12-16 | Disposition: A | Discharge status: Home / Self Care | Payer: Medicare Other | Source: Ambulatory Visit | Attending: Internal Medicine | Admitting: Internal Medicine

## 2013-12-16 ENCOUNTER — Telehealth: Payer: Self-pay | Admitting: *Deleted

## 2013-12-16 ENCOUNTER — Encounter (INDEPENDENT_AMBULATORY_CARE_PROVIDER_SITE_OTHER): Payer: Self-pay | Admitting: Surgery

## 2013-12-16 ENCOUNTER — Encounter: Payer: Self-pay | Admitting: Internal Medicine

## 2013-12-16 ENCOUNTER — Ambulatory Visit (INDEPENDENT_AMBULATORY_CARE_PROVIDER_SITE_OTHER): Payer: Medicare Other | Admitting: Internal Medicine

## 2013-12-16 VITALS — BP 138/78 | HR 103 | Temp 98.0°F | Resp 18 | Ht 71.0 in | Wt 218.0 lb

## 2013-12-16 VITALS — BP 175/72 | HR 85 | Temp 98.2°F | Ht 71.0 in | Wt 215.0 lb

## 2013-12-16 DIAGNOSIS — R7881 Bacteremia: Secondary | ICD-10-CM

## 2013-12-16 DIAGNOSIS — J189 Pneumonia, unspecified organism: Secondary | ICD-10-CM

## 2013-12-16 DIAGNOSIS — K4030 Unilateral inguinal hernia, with obstruction, without gangrene, not specified as recurrent: Secondary | ICD-10-CM

## 2013-12-16 DIAGNOSIS — B952 Enterococcus as the cause of diseases classified elsewhere: Secondary | ICD-10-CM

## 2013-12-16 MED ORDER — AMOXICILLIN 500 MG PO CAPS: 500.0000 mg | ORAL_CAPSULE | Freq: Three times a day (TID) | ORAL | Status: DC

## 2013-12-16 NOTE — Telephone Encounter (Signed)
Received call from [facility], stating they were faxing over the report of his CXR for review.

## 2013-12-16 NOTE — Progress Notes (Signed)
Patient ID: Barry Morgan, male   DOB: 09-Aug-1941, 73 y.o.   MRN: [id number]         [facility]  Patient Active Problem List   Diagnosis Date Noted  . Incarcerated inguinal hernia 12/02/2013    Priority: High  . Enterococcal bacteremia 12/02/2013    Priority: High  . HCAP (healthcare-associated pneumonia) 11/30/2013    Priority: High  . History of total hip arthroplasty 12/02/2013    Priority: Medium  . Hyperglycemia 12/02/2013  . GERD (gastroesophageal reflux disease) 12/02/2013  . Ex-cigarette smoker 12/02/2013  . CAD (coronary artery disease) 11/30/2013  . HLD (hyperlipidemia) 11/30/2013  . Acute respiratory failure 11/30/2013  . Hyponatremia 11/26/2013  . Postoperative anemia due to acute blood loss 11/26/2013  . OA (osteoarthritis) of hip 11/25/2013    Patient's Medications  New Prescriptions   No medications on file  Previous Medications   ACETAMINOPHEN (TYLENOL PO)    Take by mouth. IF NEEDED 500 MG FOR PAIN   AMIODARONE (PACERONE) 200 MG TABLET    Take 200 mg by mouth at bedtime.   ASPIRIN EC 81 MG TABLET    Take 1 tablet (81 mg total) by mouth daily.   ATORVASTATIN (LIPITOR) 40 MG TABLET    Take 40 mg by mouth at bedtime.   CLOPIDOGREL (PLAVIX) 75 MG TABLET    Take 1 tablet (75 mg total) by mouth daily with breakfast.   DOCUSATE SODIUM 100 MG CAPS    Take 100 mg by mouth 2 (two) times daily.   IRON POLYSACCHARIDES (NIFEREX) 150 MG CAPSULE    Take 1 capsule (150 mg total) by mouth 2 (two) times daily.   LEVOFLOXACIN (LEVAQUIN) 500 MG TABLET    Take 1 tablet (500 mg total) by mouth daily.   LISINOPRIL (PRINIVIL,ZESTRIL) 5 MG TABLET    Take 5 mg by mouth at bedtime.   METHOCARBAMOL (ROBAXIN) 500 MG TABLET    Take 1 tablet (500 mg total) by mouth every 6 (six) hours as needed for muscle spasms.   METOCLOPRAMIDE (REGLAN) 5 MG TABLET    Take 1-2 tablets (5-10 mg total) by mouth every 8 (eight) hours as needed for nausea (if ondansetron (ZOFRAN)  ineffective.).   ONDANSETRON (ZOFRAN) 4 MG TABLET    Take 1 tablet (4 mg total) by mouth every 6 (six) hours as needed for nausea.   OXYCODONE (OXY IR/ROXICODONE) 5 MG IMMEDIATE RELEASE TABLET    Take 1-2 tablets (5-10 mg total) by mouth every 3 (three) hours as needed for breakthrough pain.   PANTOPRAZOLE (PROTONIX) 40 MG TABLET    Take 40 mg by mouth daily. TAKES IN AM   POLYETHYLENE GLYCOL (MIRALAX / GLYCOLAX) PACKET    Take 17 g by mouth daily as needed for mild constipation.   SODIUM CHLORIDE 0.9 % SOLN 250 ML WITH VANCOMYCIN 10 G SOLR    Inject into the vein every 12 (twelve) hours.   SODIUM CHLORIDE 0.9 % SOLN 500 ML WITH VANCOMYCIN 10 G SOLR 1,500 MG    Inject 1,500 mg into the vein every 12 (twelve) hours.   TAMSULOSIN (FLOMAX) 0.4 MG CAPS CAPSULE    Take 0.4 mg by mouth daily after supper.   TRAMADOL (ULTRAM) 50 MG TABLET    Take 1-2 tablets (50-100 mg total) by mouth every 6 (six) hours as needed (mild pain).   ZOLPIDEM (AMBIEN) 5 MG TABLET    Take 1 tablet (5 mg total) by mouth at bedtime as needed  for sleep.  Modified Medications   No medications on file  Discontinued Medications   No medications on file    Subjective: Barry Morgan is in for his hospital followup visit.  He underwent left total hip arthroplasty one month ago. His initial postoperative course was uneventful and he was discharged to skilled nursing facility but readmitted within 24 hours with an incarcerated left inguinal hernia, healthcare associated pneumonia and enterococcal bacteremia. He was treated with IV vancomycin and piperacillin tazobactam. Repeat blood cultures were negative. There was no evidence of left hip infection. He underwent surgery repair of the incarcerated hernia. He was discharged home on IV vancomycin and oral levofloxacin. He completed a total of 8 days of therapy for healthcare associated pneumonia and is now in his 16th day of vancomycin for his bacteremia.  He is not having any left hip pain  or wound drainage. He is developed some swelling of his left inguinal surgical site but has no pain or wound drainage. He has very minimal cough which has improved significantly but he is noting more dyspnea on exertion. He has not any fever, chills or sweats.  His appetite is fair but he does not feel like he has been losing weight.  Review of Systems: Pertinent items are noted in HPI.  Past Medical History  Diagnosis Date  . Hyperlipidemia   . Eye globe prosthesis     RIGHT  . GERD (gastroesophageal reflux disease)   . Hypertension   . Dysrhythmia   . BPH (benign prostatic hypertrophy)   . PONV (postoperative nausea and vomiting)     SICK FOR 3 DAYS AFTER COLONOSCOPY  . Coronary artery disease     DRUG ELUTING STENT DISTAL RCA 04/2012  . Shortness of breath     ONLY IF CLIMBING 2 OR 3 FLIGHTS OF STAIRS  . Arthritis     PAIN AND OA LEFT HIP  . Incarcerated inguinal hernia 12/02/2013    Incarcerated left inguinal hernia repaired 11/30/13 with mesh     History  Substance Use Topics  . Smoking status: Former Smoker    Types: Cigars  . Smokeless tobacco: Never Used  . Alcohol Use: Yes     Comment: QUIT SMOKING 15 YRS AGO   COUPLE OF GLASSES WINE DURING WEEK    No family history on file.  No Known Allergies  Objective: Temp: 98.2 F (36.8 C) (01/07 1328) Temp src: Oral (01/07 1328) BP: 175/72 mmHg (01/07 1328) Pulse Rate: 85 (01/07 1328)  General: He is well dressed and in no distress Skin: Left arm PICC site appears normal Lungs: Clear; oxygen saturation greater than equal to 94% on room air with ambulation Cor: Regular S1 and S2 no murmurs Abdomen: Soft and nontender. He has developed significant swelling and fluctuance around his left inguinal incision. There is no wound drainage, erythema, warmth or pain with palpation Left hip: He had some pitting edema of his left hip and buttock. The incision is healing nicely without any drainage, erythema, warmth or pain on  palpation  CBC 12/14/2013: WBC 4,700, hemoglobin 9.3, platelet 449,000 Creatinine 1.03 Vancomycin trough 10.1  CHEST 2 VIEW 12/16/2013 COMPARISON: 12/02/2013.  FINDINGS:  Trachea is midline. Heart size normal. Lungs are clear. No pleural  fluid. Left PICC is in the right atrium. Old right rib fractures.  IMPRESSION:  1. Left PICC appears low lying, in the right atrium and could be  retracted approximately 4 cm for better positioning, as clinically  indicated. This will be  a call report.  2. No acute findings in the chest.  Electronically Signed  By: Leanna Battles M.D.  On: 12/16/2013 15:11  Assessment: Barry Morgan is improving. It appears that his pneumonia has resolved. I suspect that his dyspnea on exertion is related to general debilitation due to to his acute illness, infection and 2 recent surgeries. His hemoglobin is low but has been stable over the past 3 weeks. I do not see any evidence of infection of his new left hip prosthesis. He has developed a seroma around his left inguinal hernia incision site but it does not appear to be infected. I will discontinue vancomycin and switched to oral amoxicillin for several more weeks.  Plan: 1. Discontinue vancomycin 2. Amoxicillin 500 mg 3 times a day 3. Remove PICC 4. Follow up in 3 weeks   Cliffton Asters, MD [facility] [phone] pager   [phone] cell 12/16/2013, 1:53 PM

## 2013-12-16 NOTE — Telephone Encounter (Signed)
Amy called to see what the orders were for this patient after his visit today. Advised her the Med and PICC were D/C and patient was put on oral antibiotics.

## 2013-12-16 NOTE — Progress Notes (Signed)
[facility]  Ovidio Kinavid Annais Crafts, MD,  FACS [address]  [address] [place], [place]    [id number] Phone:  [phone] FAX:  [phone]   Re:   Barry BeetsMorris Pund DOB:   [birth year] MRN:   [id number]  ASSESSMENT AND PLAN: 1. Left HERNIA REPAIR INGUINAL INCARCERATED,  INSERTION OF MESH - 11/30/2013 - D. Nazire Fruth   Has seroma at wound site.  Will leave alone for now   Will see back in one month.  2. Bilateral pneumonia (community acquired vs aspiration?)   Hospitalized 11/29/2013 - 12/05/2013  Enterococcal bacteremia  Still on IV Vanc.  The exact time of stopping this is unclear to the patient.  He is seeing Dr. Orvan Falconerampbell later today.  3.  Left hip surgery - 11/25/2013 - F. Alucio  He has done well with this. 4.  CAD 5.  Anemia  Hgb - 9.1 (copy given to patient)  He is to see his PCP later today.  They may want to discuss his BP meds. 6.  Continued weakness.   HISTORY OF PRESENT ILLNESS: Chief Complaint  Patient presents with  . Routine Post Op    sx 11-30-13 hernia    Barry BeetsMorris Zachar is a 73 y.o. (DOB: [birth year])  white  male who is a patient of CLARK,KATHERINE, MD and comes to me today for follow up of an incarcerated left inguinal hernia. Comes with son.  He had a left hip surgery by Dr. Rocky MorelAlucia on 11/25/2013.  Developed nausea and vomiting and got pneumonia (unclear whether this was aspiration or not).  [facility] a [facility] and was found to have a incarcerated left inguinal hernia.  I repaired the incarcerated left inguinal hernia on 11/30/2013.  In some ways, he did better than expected in the hospital.  One expectation was that he could possibly be placed on prolonged intubation, but he was not. Since discharge, his biggest complaint has been weakness and some unsteadiness.  Past Medical History  Diagnosis Date  . Hyperlipidemia   . Eye globe prosthesis     RIGHT  . GERD (gastroesophageal reflux disease)   .  Hypertension   . Dysrhythmia   . BPH (benign prostatic hypertrophy)   . PONV (postoperative nausea and vomiting)     SICK FOR 3 DAYS AFTER COLONOSCOPY  . Coronary artery disease     DRUG ELUTING STENT DISTAL RCA 04/2012  . Shortness of breath     ONLY IF CLIMBING 2 OR 3 FLIGHTS OF STAIRS  . Arthritis     PAIN AND OA LEFT HIP  . Incarcerated inguinal hernia 12/02/2013    Incarcerated left inguinal hernia repaired 11/30/13 with mesh     SOCIAL HISTORY:   PHYSICAL EXAM: BP 138/78  Pulse 103  Temp(Src) 98 F (36.7 C)  Resp 18  Ht 5\' 11"  (1.803 m)  Wt 218 lb (98.884 kg)  BMI 30.42 kg/m2  Abdomen - Seroma at left groin area.  About 4 x 8 cm.  Not infected.  Not tender, but moderate sized.  DATA REVIEWED: Epic notes.  Ovidio Kinavid Tinsleigh Slovacek, MD,  [facility] [address]  [address]   [place], [place]    [id number] Phone:  [phone] FAX:  [phone]

## 2013-12-17 ENCOUNTER — Other Ambulatory Visit: Payer: Self-pay | Admitting: *Deleted

## 2013-12-18 ENCOUNTER — Ambulatory Visit (INDEPENDENT_AMBULATORY_CARE_PROVIDER_SITE_OTHER): Payer: Medicare Other | Admitting: General Surgery

## 2013-12-18 ENCOUNTER — Telehealth (INDEPENDENT_AMBULATORY_CARE_PROVIDER_SITE_OTHER): Payer: Self-pay

## 2013-12-18 ENCOUNTER — Encounter (INDEPENDENT_AMBULATORY_CARE_PROVIDER_SITE_OTHER): Payer: Self-pay | Admitting: General Surgery

## 2013-12-18 VITALS — BP 138/68 | HR 76 | Temp 98.0°F | Resp 15 | Ht 71.0 in | Wt 217.4 lb

## 2013-12-18 DIAGNOSIS — Z5189 Encounter for other specified aftercare: Secondary | ICD-10-CM

## 2013-12-18 DIAGNOSIS — T888XXD Other specified complications of surgical and medical care, not elsewhere classified, subsequent encounter: Principal | ICD-10-CM

## 2013-12-18 DIAGNOSIS — IMO0001 Reserved for inherently not codable concepts without codable children: Secondary | ICD-10-CM

## 2013-12-18 DIAGNOSIS — T792XXD Traumatic secondary and recurrent hemorrhage and seroma, subsequent encounter: Principal | ICD-10-CM

## 2013-12-18 MED ORDER — CEPHALEXIN 500 MG PO CAPS: 500.0000 mg | ORAL_CAPSULE | Freq: Four times a day (QID) | ORAL | Status: DC

## 2013-12-18 NOTE — Progress Notes (Signed)
Barry Morgan is a 73 y.o. male who is status post a Inguinal hernia repair on 12/22.  He was seen Dr Ezzard StandingNewman last week where he was thought he had a seroma.  He presents to the office today due to 24 hours of increasing redness around his incision.  Objective: Filed Vitals:   12/18/13 1538  BP: 138/68  Pulse: 76  Temp: 98 F (36.7 C)  Resp: 15    General appearance: alert and cooperative GI: normal findings: soft, non-tender  Incision: There is some slight erythema it in the middle portion of his incision. There is no drainage. There is obvious for a collection underneath.   Assessment: s/p  Patient Active Problem List   Diagnosis Date Noted  . Incarcerated inguinal hernia 12/02/2013  . Enterococcal bacteremia 12/02/2013  . History of total hip arthroplasty 12/02/2013  . Hyperglycemia 12/02/2013  . GERD (gastroesophageal reflux disease) 12/02/2013  . Ex-cigarette smoker 12/02/2013  . HCAP (healthcare-associated pneumonia) 11/30/2013  . CAD (coronary artery disease) 11/30/2013  . HLD (hyperlipidemia) 11/30/2013  . Acute respiratory failure 11/30/2013  . Hyponatremia 11/26/2013  . Postoperative anemia due to acute blood loss 11/26/2013  . OA (osteoarthritis) of hip 11/25/2013   Procedure: Seroma drainage Surgeon: Maisie Fushomas Assistant: Christella ScheuermannGlaspey After the risks and benefits were explained, verbal consent was obtained for above procedure  Anesthesia: none Diagnosis: seroma The area of the seroma was cleaned with alcohol. 18-gauge needle was then inserted into the seroma cavity and straw-colored fluid was withdrawn. Some of this was sent to microbiology for cultures. I drained approximately 150 mL of fluid in total. The area was covered with a sterile Band-Aid   Plan: Given his skin erythema I have decided to drain a portion of the seroma and sent this for cultures. I will place him on Keflex until we get the results back. He is scheduled to see Dr. Ezzard StandingNewman in a couple  weeks.    Vanita Panda.Leyani Gargus C Luccia Reinheimer, MD [facility] [place] [phone]   12/18/2013 4:06 PM

## 2013-12-18 NOTE — Patient Instructions (Addendum)
We will call you with the results next week.  Call us if your skin redness gets worse.

## 2013-12-18 NOTE — Telephone Encounter (Signed)
Patient states his incision is red and hot to the touch , he feels he is rejecting the mesh. Urg today with DR. Byerly @345pm 

## 2013-12-21 ENCOUNTER — Telehealth (INDEPENDENT_AMBULATORY_CARE_PROVIDER_SITE_OTHER): Payer: Self-pay

## 2013-12-21 LAB — WOUND CULTURE
Gram Stain: NONE SEEN
Gram Stain: NONE SEEN
Organism ID, Bacteria: NO GROWTH

## 2013-12-21 NOTE — Telephone Encounter (Signed)
Patient states he has been having nose bleeds he stopped Ibuprofen , no nose bleeds this am but is having dizzinesss. Advised him to call his PCP , it could be his blood pressure or sinus . Patient verbalized understanding. Advised to call if no results

## 2013-12-21 NOTE — Telephone Encounter (Signed)
Left message for the pt with Dr Maisie Fushomas' results below.  I asked him to continue the antibiotics and we will call with a final report.

## 2013-12-21 NOTE — Telephone Encounter (Signed)
Message copied by Ivory BroadGLASPEY, Warren Lindahl on Mon Dec 21, 2013  9:32 AM ------      Message from: MelvilleHOMAS, Broadus JohnALICIA C.      Created: Mon Dec 21, 2013  7:34 AM       Please let him know that the prelim culture shows no signs of infection.  He should continue the antibiotics until the culture is finalized (4 days).            AT      ----- Message -----         From: [facility] Interface         Sent: 12/19/2013  12:15 PM           To: Romie LeveeAlicia Thomas, MD                   ------

## 2014-01-04 ENCOUNTER — Ambulatory Visit (INDEPENDENT_AMBULATORY_CARE_PROVIDER_SITE_OTHER): Payer: Medicare Other | Admitting: Internal Medicine

## 2014-01-04 VITALS — BP 147/79 | HR 87 | Temp 98.1°F | Ht 72.0 in | Wt 214.0 lb

## 2014-01-04 DIAGNOSIS — B952 Enterococcus as the cause of diseases classified elsewhere: Secondary | ICD-10-CM

## 2014-01-04 DIAGNOSIS — R7881 Bacteremia: Secondary | ICD-10-CM

## 2014-01-04 MED ORDER — AMOXICILLIN 500 MG PO CAPS: 500.0000 mg | ORAL_CAPSULE | Freq: Three times a day (TID) | ORAL | Status: AC

## 2014-01-04 NOTE — Progress Notes (Signed)
Patient ID: Barry Morgan, male   DOB: September 11, 1941, 73 y.o.   MRN: [id number]         [facility]  Patient Active Problem List   Diagnosis Date Noted  . Incarcerated inguinal hernia 12/02/2013    Priority: High  . Enterococcal bacteremia 12/02/2013    Priority: High  . HCAP (healthcare-associated pneumonia) 11/30/2013    Priority: High  . History of total hip arthroplasty 12/02/2013    Priority: Medium  . Hyperglycemia 12/02/2013  . GERD (gastroesophageal reflux disease) 12/02/2013  . Ex-cigarette smoker 12/02/2013  . CAD (coronary artery disease) 11/30/2013  . HLD (hyperlipidemia) 11/30/2013  . Acute respiratory failure 11/30/2013  . Hyponatremia 11/26/2013  . Postoperative anemia due to acute blood loss 11/26/2013  . OA (osteoarthritis) of hip 11/25/2013    Patient's Medications  New Prescriptions   No medications on file  Previous Medications   AMIODARONE (PACERONE) 200 MG TABLET    Take 200 mg by mouth at bedtime.   ASPIRIN EC 81 MG TABLET    Take 1 tablet (81 mg total) by mouth daily.   ATORVASTATIN (LIPITOR) 40 MG TABLET    Take 40 mg by mouth at bedtime.   CLOPIDOGREL (PLAVIX) 75 MG TABLET    Take 1 tablet (75 mg total) by mouth daily with breakfast.   FERROUS SULFATE 325 (65 FE) MG TABLET    Take by mouth daily.   LISINOPRIL (PRINIVIL,ZESTRIL) 5 MG TABLET    Take 5 mg by mouth at bedtime.   MELOXICAM (MOBIC) 7.5 MG TABLET    TAKE ONE TABLET BY MOUTH ONCE DAILY   MULTIPLE VITAMINS-MINERALS (HAIR/SKIN/NAILS) TABS    daily.   PANTOPRAZOLE (PROTONIX) 40 MG TABLET    Take 40 mg by mouth daily. TAKES IN AM   TAMSULOSIN (FLOMAX) 0.4 MG CAPS CAPSULE    Take 0.4 mg by mouth daily after supper.   TRAMADOL (ULTRAM) 50 MG TABLET    50 mg.  Modified Medications   Modified Medication Previous Medication   AMOXICILLIN (AMOXIL) 500 MG CAPSULE amoxicillin (AMOXIL) 500 MG capsule      Take 1 capsule (500 mg total) by mouth 3 (three) times daily.    Take 1  capsule (500 mg total) by mouth 3 (three) times daily.  Discontinued Medications   CEPHALEXIN (KEFLEX) 500 MG CAPSULE    Take 1 capsule (500 mg total) by mouth 4 (four) times daily.    Subjective: Barry Morgan is in for his routine followup visit. He has now completed 5 weeks of antibiotic therapy for his enterococcal bacteremia including oral amoxicillin for the last 19 days. He is feeling better. He had developed a seroma at the site of his left inguinal hernia repair and had 150 cc of fluid aspirated on January 9. Gram stain and culture of the fluid was negative. He was given oral cephalexin for about 1 week in addition to the amoxicillin. The area of swelling over his left groin has decreased but he thinks it is still slightly warm. It is not painful. He occasionally has a little bit of discomfort in his left hip when the weather changes but overall is feeling much better. He has not had any fever, chills or sweats. He has tolerated his antibiotics well with the exception that he began noticing a lot of body odor shortly after starting amoxicillin.  Review of Systems: Pertinent items are noted in HPI.  Past Medical History  Diagnosis Date  . Hyperlipidemia   .  Eye globe prosthesis     RIGHT  . GERD (gastroesophageal reflux disease)   . Hypertension   . Dysrhythmia   . BPH (benign prostatic hypertrophy)   . PONV (postoperative nausea and vomiting)     SICK FOR 3 DAYS AFTER COLONOSCOPY  . Coronary artery disease     DRUG ELUTING STENT DISTAL RCA 04/2012  . Shortness of breath     ONLY IF CLIMBING 2 OR 3 FLIGHTS OF STAIRS  . Arthritis     PAIN AND OA LEFT HIP  . Incarcerated inguinal hernia 12/02/2013    Incarcerated left inguinal hernia repaired 11/30/13 with mesh     History  Substance Use Topics  . Smoking status: Former Smoker    Types: Cigars  . Smokeless tobacco: Never Used  . Alcohol Use: Yes     Comment: QUIT SMOKING 15 YRS AGO   COUPLE OF GLASSES WINE DURING WEEK    No  family history on file.  No Known Allergies  Objective: Temp: 98.1 F (36.7 C) (01/26 1340) Temp src: Oral (01/26 1340) BP: 147/79 mmHg (01/26 1340) Pulse Rate: 87 (01/26 1340)  General: He is well dressed and in good spirits Skin: No rash Lungs: Clear Cor: Regular S1-S2 with no murmurs Abdomen: Soft and nontender. The seroma over his left groin incision has decreased in size. It may be slightly warmer than on the right side but there is no erythema and it is nonpainful with palpation. Left hip: His incision is healed nicely. He has some induration diffusely but no redness, fluctuance or other signs of infection    Assessment: I suspect that his enterococcal bacteremia has resolved. I will have him complete the last 3 days of his amoxicillin and then stop and observe off of antibiotics. He knows to call he has any signs or symptoms suggesting early relapse of infection.  Plan: 1. Complete amoxicillin on January 29 2. Followup in 4-6 weeks   Cliffton AstersJohn Miracle Criado, MD [facility] [phone] pager   [phone] cell 01/04/2014, 1:54 PM

## 2014-01-13 ENCOUNTER — Ambulatory Visit (INDEPENDENT_AMBULATORY_CARE_PROVIDER_SITE_OTHER): Payer: Medicare Other | Admitting: Surgery

## 2014-01-13 ENCOUNTER — Encounter (INDEPENDENT_AMBULATORY_CARE_PROVIDER_SITE_OTHER): Payer: Self-pay | Admitting: Surgery

## 2014-01-13 VITALS — BP 132/78 | HR 78 | Temp 98.0°F | Resp 18 | Ht 72.0 in | Wt 219.0 lb

## 2014-01-13 DIAGNOSIS — K4030 Unilateral inguinal hernia, with obstruction, without gangrene, not specified as recurrent: Secondary | ICD-10-CM

## 2014-01-13 NOTE — Progress Notes (Signed)
[facility]  Barry Kinavid Naithen Rivenburg, MD,  FACS [address]  [address] [place], [place]    [id number] Phone:  [phone] FAX:  [phone]   Re:   Barry BeetsMorris Morgan DOB:   10/12/1941 MRN:   [id number]  ASSESSMENT AND PLAN: 1. Left HERNIA REPAIR INGUINAL INCARCERATED,  INSERTION OF MESH - 11/30/2013 - Barry Morgan   Seroma at wound site - aspirated today.  He looks much better than last time.  Will see back in one month to recheck wound.  2. Bilateral pneumonia (community acquired vs aspiration?)   Hospitalized 11/29/2013 - 12/05/2013 3.  Left hip surgery - 11/25/2013 - Barry Morgan  He has done well with this. 4.  CAD 5.  Anemia  Hgb - 9.1   6.  Continued weakness.   HISTORY OF PRESENT ILLNESS: Chief Complaint  Patient presents with  . Routine Post Op    seroma   Barry Morgan is a 73 y.o. (DOB: 10/12/1941)  white  male who is a patient of CLARK,KATHERINE, MD and comes to me today for follow up of an incarcerated left inguinal hernia. He looks better than when I last saw him. Barry Morgan aspirated his left groin on 12/18/2013.  Final cultures were negative. The wound looks less impressive to me today, but I went on and aspirated it anyway.  Past Medical History  Diagnosis Date  . Hyperlipidemia   . Eye globe prosthesis     RIGHT  . GERD (gastroesophageal reflux disease)   . Hypertension   . Dysrhythmia   . BPH (benign prostatic hypertrophy)   . PONV (postoperative nausea and vomiting)     SICK FOR 3 DAYS AFTER COLONOSCOPY  . Coronary artery disease     DRUG ELUTING STENT DISTAL RCA 04/2012  . Shortness of breath     ONLY IF CLIMBING 2 OR 3 FLIGHTS OF STAIRS  . Arthritis     PAIN AND OA LEFT HIP  . Incarcerated inguinal hernia 12/02/2013    Incarcerated left inguinal hernia repaired 11/30/13 with mesh    SOCIAL HISTORY: Comes by self.  PHYSICAL EXAM: BP 132/78  Pulse 78  Temp(Src) 98 F (36.7 C)  Resp 18  Ht 6' (1.829 m)  Wt 219 lb (99.338 kg)   BMI 29.70 kg/m2  Abdomen - Seroma at left groin area.  Smaller and softer than last time.  Using US,  I aspirated 105 cc for straw colored serous fluid from the groin.   Medial <<<             Note needle           >>>>Lateral   "Aspirating seroma"  DATA REVIEWED: Epic notes.  Barry Kinavid Leopoldo Mazzie, MD,  [facility] [address]  [address]   [place], [place]    [id number] Phone:  [phone] FAX:  [phone]

## 2014-01-22 ENCOUNTER — Encounter (INDEPENDENT_AMBULATORY_CARE_PROVIDER_SITE_OTHER): Payer: Medicare Other | Admitting: Surgery

## 2014-02-10 ENCOUNTER — Ambulatory Visit (INDEPENDENT_AMBULATORY_CARE_PROVIDER_SITE_OTHER): Payer: Medicare Other | Admitting: Surgery

## 2014-02-10 ENCOUNTER — Encounter (INDEPENDENT_AMBULATORY_CARE_PROVIDER_SITE_OTHER): Payer: Self-pay | Admitting: Surgery

## 2014-02-10 VITALS — BP 118/68 | HR 72 | Temp 98.5°F | Resp 14 | Ht 72.0 in | Wt 220.4 lb

## 2014-02-10 DIAGNOSIS — K4030 Unilateral inguinal hernia, with obstruction, without gangrene, not specified as recurrent: Secondary | ICD-10-CM

## 2014-02-10 NOTE — Progress Notes (Signed)
[facility]  Ovidio Kinavid Jaime Dome, MD,  [facility] [address]  [address] [place], [place]    [id number] Phone:  [phone] FAX:  [phone]   Re:   Barry Morgan DOB:   [birth year] MRN:   [id number]  ASSESSMENT AND PLAN: 1. Left HERNIA REPAIR INGUINAL INCARCERATED,  INSERTION OF MESH - 11/30/2013 - D. Neesa Knapik   Still has small seroma, but this is much better.  I made this his last visit.  2. Bilateral pneumonia (community acquired vs aspiration?)   Hospitalized 11/29/2013 - 12/05/2013 3.  Left hip surgery - 11/25/2013 - F. Alucio  He has done well with this. 4.  CAD 5.  Enterococcal bacteremia - followed by Dr. Cliffton AstersJohn Campbell - completed Amoxicillin 01/08/2104.  No further issues. 6.  Right rotator cuff pain, from pushing himself up for the hip   HISTORY OF PRESENT ILLNESS: Chief Complaint  Patient presents with  . Routine Post Op    F/U Seroma   Barry Morgan is a 73 y.o. (DOB: [birth year])  white  male who is a patient of CLARK,KATHERINE, MD and comes to me today for follow up of an incarcerated left inguinal hernia. He is doing very well. His groin swelling is just about gone.  He talked a little about his right shoulder.  Past Medical History  Diagnosis Date  . Hyperlipidemia   . Eye globe prosthesis     RIGHT  . GERD (gastroesophageal reflux disease)   . Hypertension   . Dysrhythmia   . BPH (benign prostatic hypertrophy)   . PONV (postoperative nausea and vomiting)     SICK FOR 3 DAYS AFTER COLONOSCOPY  . Coronary artery disease     DRUG ELUTING STENT DISTAL RCA 04/2012  . Shortness of breath     ONLY IF CLIMBING 2 OR 3 FLIGHTS OF STAIRS  . Arthritis     PAIN AND OA LEFT HIP  . Incarcerated inguinal hernia 12/02/2013    Incarcerated left inguinal hernia repaired 11/30/13 with mesh    SOCIAL HISTORY: Comes by self.  PHYSICAL EXAM: BP 118/68  Pulse 72  Temp(Src) 98.5 F (36.9 C) (Oral)  Resp 14  Ht 6' (1.829 m)  Wt 220 lb 6.4 oz (99.973  kg)  BMI 29.89 kg/m2  Abdomen - Seroma at left groin area is just about gone.  He has minimal puffiness.  No hernia.  Wound has healed well.  DATA REVIEWED: Epic notes.  Ovidio Kinavid Eladio Dentremont, MD,  [facility] [address]  [address]   [place], [place]    [id number] Phone:  [phone] FAX:  [phone]

## 2014-02-16 ENCOUNTER — Encounter: Payer: Self-pay | Admitting: Internal Medicine

## 2014-02-16 ENCOUNTER — Ambulatory Visit (INDEPENDENT_AMBULATORY_CARE_PROVIDER_SITE_OTHER): Payer: Medicare Other | Admitting: Internal Medicine

## 2014-02-16 VITALS — BP 132/73 | HR 65 | Temp 97.9°F | Ht 71.0 in | Wt 219.0 lb

## 2014-02-16 DIAGNOSIS — R7881 Bacteremia: Secondary | ICD-10-CM

## 2014-02-16 DIAGNOSIS — B952 Enterococcus as the cause of diseases classified elsewhere: Secondary | ICD-10-CM

## 2014-02-16 NOTE — Progress Notes (Signed)
Patient ID: Barry Morgan, male   DOB: 1941/11/27, 73 y.o.   MRN: [id number]         [facility]  Patient Active Problem List   Diagnosis Date Noted  . Incarcerated inguinal hernia 12/02/2013    Priority: High  . Enterococcal bacteremia 12/02/2013    Priority: High  . HCAP (healthcare-associated pneumonia) 11/30/2013    Priority: High  . History of total hip arthroplasty 12/02/2013    Priority: Medium  . Hyperglycemia 12/02/2013  . GERD (gastroesophageal reflux disease) 12/02/2013  . Ex-cigarette smoker 12/02/2013  . CAD (coronary artery disease) 11/30/2013  . HLD (hyperlipidemia) 11/30/2013  . Acute respiratory failure 11/30/2013  . Hyponatremia 11/26/2013  . Postoperative anemia due to acute blood loss 11/26/2013  . OA (osteoarthritis) of hip 11/25/2013    Patient's Medications  New Prescriptions   No medications on file  Previous Medications   AMIODARONE (PACERONE) 200 MG TABLET    Take 200 mg by mouth at bedtime.   ASPIRIN EC 81 MG TABLET    Take 1 tablet (81 mg total) by mouth daily.   ATORVASTATIN (LIPITOR) 40 MG TABLET    Take 40 mg by mouth at bedtime.   CLOPIDOGREL (PLAVIX) 75 MG TABLET    Take 1 tablet (75 mg total) by mouth daily with breakfast.   FERROUS SULFATE 325 (65 FE) MG TABLET    Take by mouth daily.   LISINOPRIL (PRINIVIL,ZESTRIL) 5 MG TABLET    Take 5 mg by mouth at bedtime.   MELOXICAM (MOBIC) 7.5 MG TABLET    TAKE ONE TABLET BY MOUTH ONCE DAILY   MULTIPLE VITAMINS-MINERALS (HAIR/SKIN/NAILS) TABS    daily.   PANTOPRAZOLE (PROTONIX) 40 MG TABLET    Take 40 mg by mouth daily. TAKES IN AM   TAMSULOSIN (FLOMAX) 0.4 MG CAPS CAPSULE    Take 0.4 mg by mouth daily after supper.   TRAMADOL (ULTRAM) 50 MG TABLET    50 mg.  Modified Medications   No medications on file  Discontinued Medications   No medications on file    Subjective: [name] is in for his routine followup visit. He has been off of robotics since January 29. He  completed 4 weeks of total therapy for his enterococcal bacteremia. He is feeling much better and has had no fever, chills or sweats. He is not having any left hip pain and his left groin seroma is slowly resolving.  Review of Systems: Pertinent items are noted in HPI.  Past Medical History  Diagnosis Date  . Hyperlipidemia   . Eye globe prosthesis     RIGHT  . GERD (gastroesophageal reflux disease)   . Hypertension   . Dysrhythmia   . BPH (benign prostatic hypertrophy)   . PONV (postoperative nausea and vomiting)     SICK FOR 3 DAYS AFTER COLONOSCOPY  . Coronary artery disease     DRUG ELUTING STENT DISTAL RCA 04/2012  . Shortness of breath     ONLY IF CLIMBING 2 OR 3 FLIGHTS OF STAIRS  . Arthritis     PAIN AND OA LEFT HIP  . Incarcerated inguinal hernia 12/02/2013    Incarcerated left inguinal hernia repaired 11/30/13 with mesh     History  Substance Use Topics  . Smoking status: Former Smoker    Types: Cigars  . Smokeless tobacco: Never Used  . Alcohol Use: Yes     Comment: QUIT SMOKING 15 YRS AGO   COUPLE OF GLASSES WINE DURING  WEEK    No family history on file.  No Known Allergies  Objective: Temp: 97.9 F (36.6 C) (03/10 1036) Temp src: Oral (03/10 1036) BP: 132/73 mmHg (03/10 1036) Pulse Rate: 65 (03/10 1036)  General: He is in very good spirits Abdomen: Left inguinal and left hip incisions are fully healed without evidence of infection. His seroma has decreased in size.   Assessment: It appears that his enterococcal bacteremia has been cured.  Plan: 1. Continue observation off of antibiotics 2. Followup here as needed   Cliffton AstersJohn Aarib Pulido, MD [facility] [phone] pager   [phone] cell 02/16/2014, 10:46 AM

## 2014-04-01 ENCOUNTER — Encounter: Payer: Self-pay | Admitting: Internal Medicine

## 2014-04-06 ENCOUNTER — Encounter: Payer: Self-pay | Admitting: Internal Medicine

## 2015-11-20 IMAGING — CT CT ABD-PELV W/ CM
2 of 5 series · 11 of 32 positions shown, 16 images · IV contrast (OMNIPAQUE 300)
Comparison: Chest and abdominal radiographs performed 11/29/2013

CLINICAL DATA: Chest pain, constipation and decreased O2
saturation. History of smoking.

EXAM:
CT ANGIOGRAPHY CHEST
CT ABDOMEN AND PELVIS WITH CONTRAST
TECHNIQUE: Multidetector CT imaging of the chest was performed using the
standard protocol during bolus administration of intravenous
contrast. Multiplanar CT image reconstructions including MIPs were
obtained to evaluate the vascular anatomy. Multidetector CT imaging
of the abdomen and pelvis was performed using the standard protocol
during bolus administration of intravenous contrast.
CONTRAST:  100mL OMNIPAQUE IOHEXOL 350 MG/ML SOLN

[Series 8: thins for pacs · axial · 0.75mm/px · z∈[+1494,+1669]mm · 7 of 239 slices shown]
[im 16/239  soft-tissue]
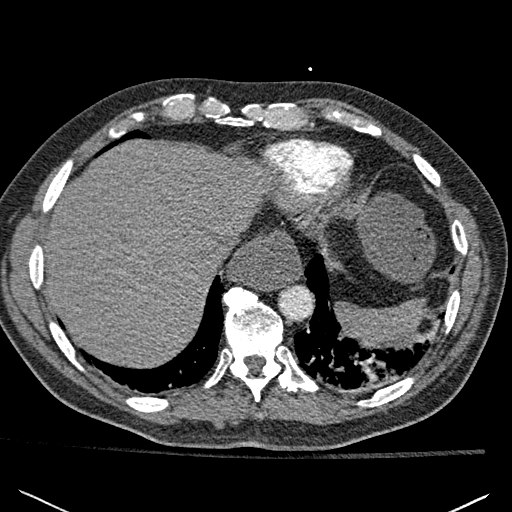
[im 48/239  soft-tissue]
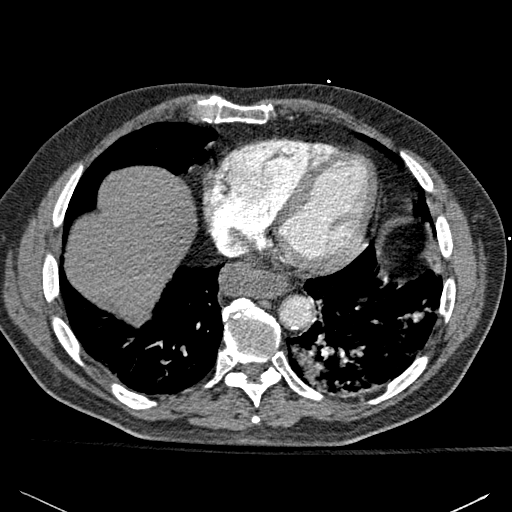
[im 80/239  soft-tissue]
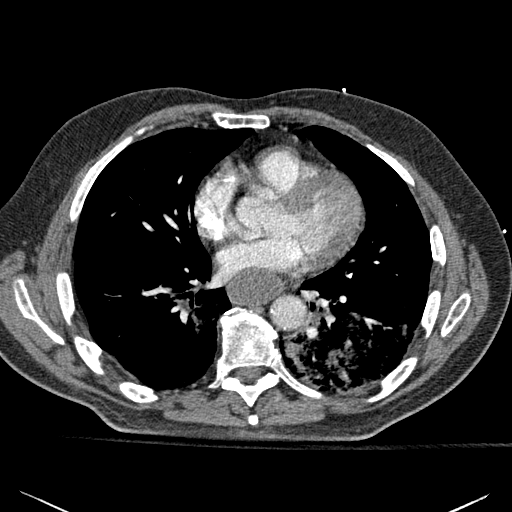
[im 112/239  soft-tissue]
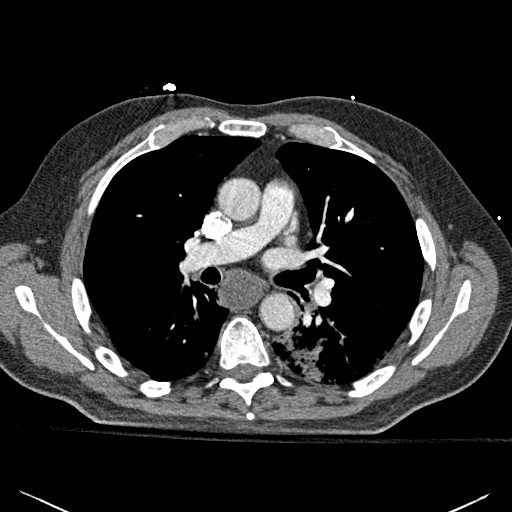
[im 127/239  soft-tissue]
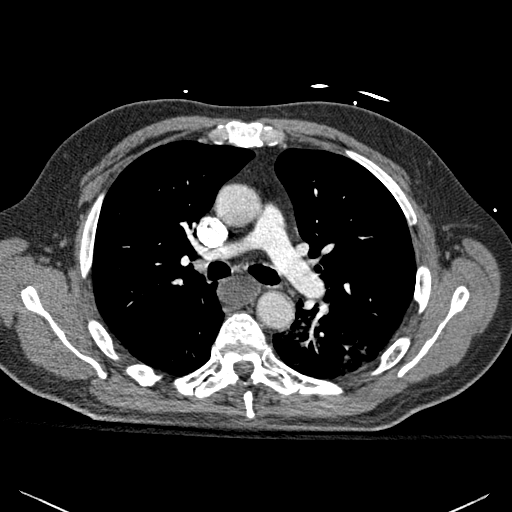
[im 159/239  soft-tissue]
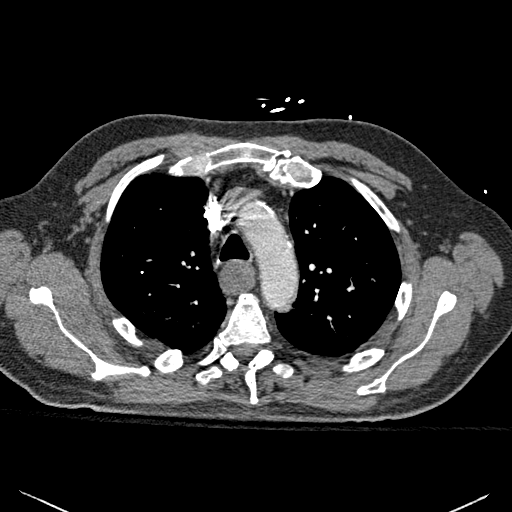
[im 191/239  soft-tissue]
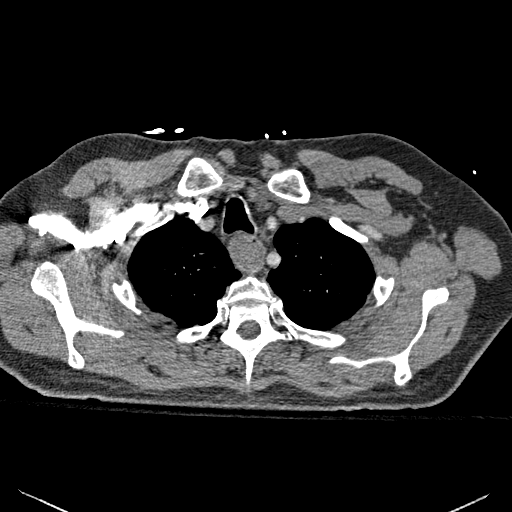

[Series 14: abd/pel with · axial · 0.74mm/px · z∈[+1172,+1457]mm · 4 of 96 slices shown, 9 images]
[im 20/96  soft-tissue]
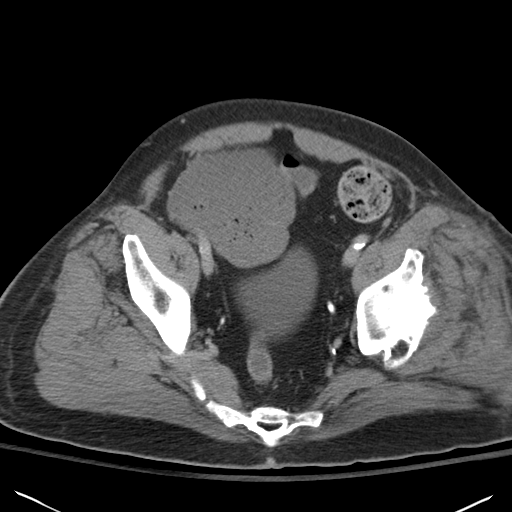
[im 20/96  lung]
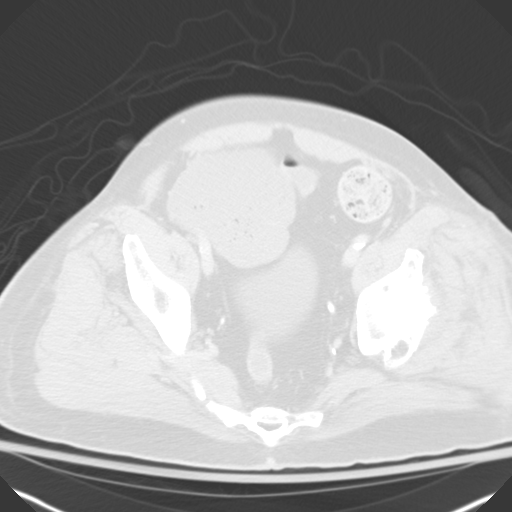
[im 20/96  bone]
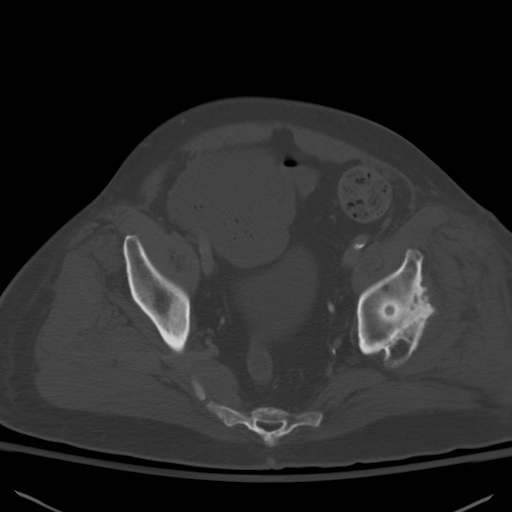
[im 39/96  soft-tissue]
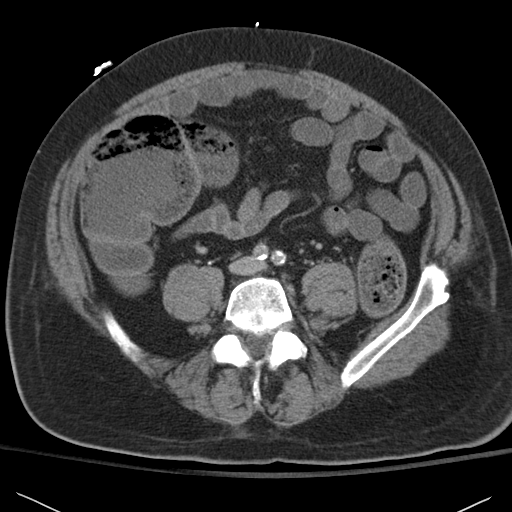
[im 39/96  lung]
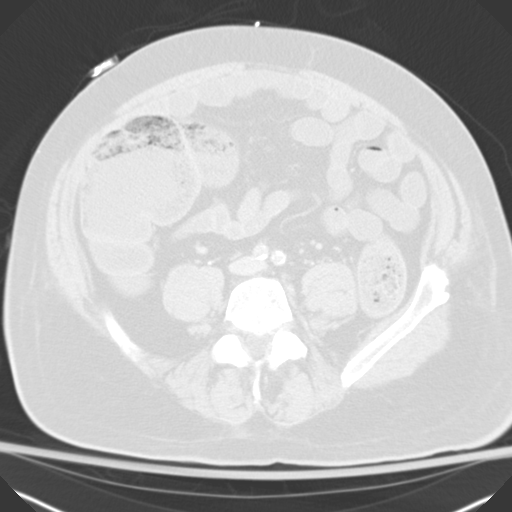
[im 58/96  soft-tissue]
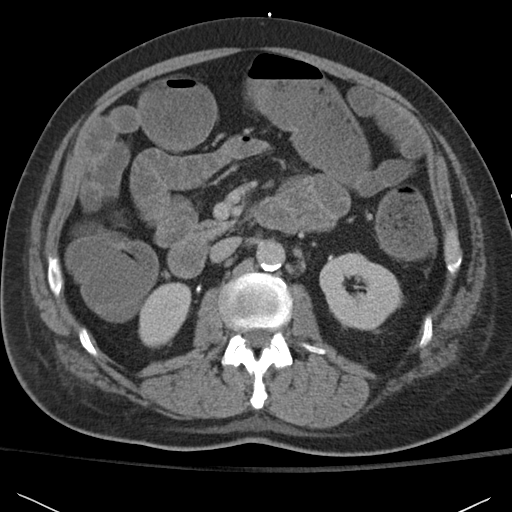
[im 58/96  lung]
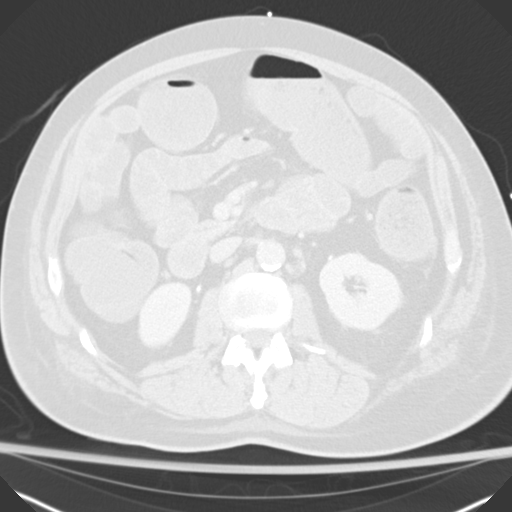
[im 77/96  soft-tissue]
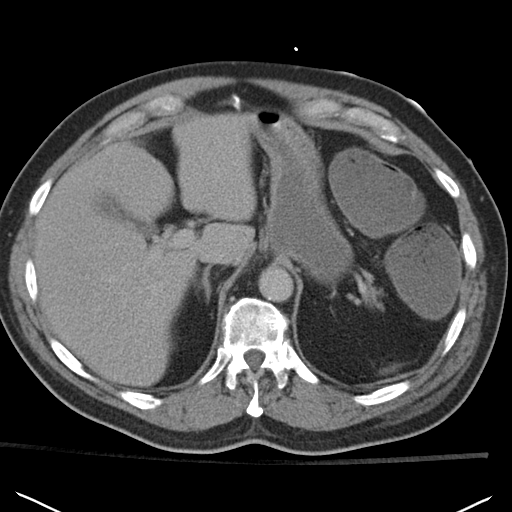
[im 77/96  lung]
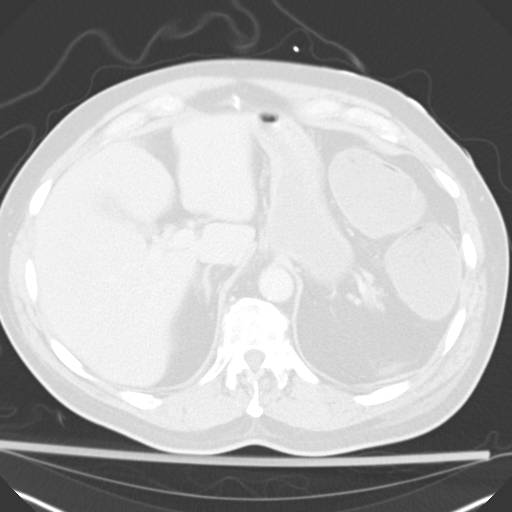

[11 of 32 positions shown; findings below may reference images not displayed]

FINDINGS: CTA CHEST FINDINGS

There is no evidence of pulmonary embolus. There is no evidence of
aortic dissection. The thoracic aorta is unremarkable in appearance.
The proximal great vessels are within normal limits.

Fluffy bilateral airspace opacification is noted in the lower lung
lobes and to a lesser extent within the posterior right upper lobe.
Airspace opacification is most dense at the left lower lobe.
Findings are compatible with multifocal pneumonia. There is no
evidence of pleural effusion or pneumothorax. No masses are
identified; no abnormal focal contrast enhancement is seen.

The mediastinum is unremarkable in appearance, aside diffuse
coronary artery calcification. No mediastinal lymphadenopathy is
seen. No pericardial effusion is identified.

Incidental note is made of diffuse distention of the esophagus, with
dilatation of the distal esophagus with fluid. This may reflect
esophageal dysmotility, with an associated small to moderate hiatal
hernia. No axillary lymphadenopathy is seen. The thyroid gland is
unremarkable in appearance.

No acute osseous abnormalities are seen. Healed right-sided rib
fractures are noted. Mild degenerative change is noted at the lower
cervical spine.

CT ABDOMEN and PELVIS FINDINGS

There is no evidence of aortic dissection. Scattered calcific
atherosclerotic disease is noted along the abdominal aorta, without
evidence of aneurysmal dilatation.

Scattered hypodensities are noted within the liver, measuring up to
2.5 cm in size. These are nonspecific, but may reflect cysts. The
spleen is unremarkable in appearance. The gallbladder is within
normal limits. The pancreas and adrenal glands are unremarkable.

Nonspecific perinephric stranding is noted bilaterally. The kidneys
are otherwise unremarkable in appearance. Contrast is noted filling
the renal calyces, limiting evaluation for renal stones. No
obstructing ureteral stones are seen. There is no evidence of
hydronephrosis.

The colon is diffusely distended with fluid, with the cecum
measuring up to 11.1 cm. There is gradual fecalization of the distal
descending colon. This appears to reflect distal obstruction due to
a small to moderate left inguinal hernia, containing a segment of
mildly inflamed proximal sigmoid colon. Trace associated free fluid
is seen. The small bowel is unremarkable in appearance. The stomach
is within normal limits. No acute vascular abnormalities are seen.

The appendix is unremarkable in appearance; there is no evidence for
appendicitis.

The bladder is mildly distended and grossly unremarkable the
prostate remains normal in size, with scattered calcification.
Postoperative calcifications are seen about the left hip, with
associated left hip prosthesis. The prosthesis is incompletely
imaged on this study. No inguinal lymphadenopathy is seen.

No acute osseous abnormalities are identified. Vacuum phenomenon is
noted at L4-5.

Review of the MIP images confirms the above findings.
IMPRESSION: 1. Distal obstruction at the level of the proximal sigmoid colon,
due to a small to moderate left inguinal hernia, containing a
segment of mildly inflamed proximal sigmoid colon. Diffuse
distention of the colon with fluid, measuring up to 11.1 cm at the
cecum, with gradual fecalization of the distal descending colon.
Manual reduction may be possible, though difficult.
2. Multifocal pneumonia noted, most prominent at the lower lung
lobes, particularly on the left.
3. No evidence of pulmonary embolus. No evidence of aortic
dissection.
4. Diffuse distention of the esophagus, filled with fluid, with an
associated small to moderate hiatal hernia. This raises concern for
chronic esophageal dysmotility.
5. Scattered calcific atherosclerotic disease noted along the
abdominal aorta.
6. Scattered likely hepatic cysts seen.

These results were called by telephone at the time of interpretation
on 11/30/2013 at [DATE] to Dr. ADRIANE CAMILA, who verbally
acknowledged these results.

## 2019-04-10 DEATH — deceased
